# Patient Record
Sex: Female | Born: 1985 | Race: White | Hispanic: No | Smoking: Current every day smoker
Health system: Southern US, Community
[De-identification: ages and names within clinical notes are randomized; demographics above are authoritative.]

## PROBLEM LIST (undated history)

## (undated) DIAGNOSIS — F32A Depression, unspecified: Secondary | ICD-10-CM

## (undated) DIAGNOSIS — M542 Cervicalgia: Secondary | ICD-10-CM

## (undated) DIAGNOSIS — R51 Headache: Secondary | ICD-10-CM

## (undated) DIAGNOSIS — R519 Headache, unspecified: Secondary | ICD-10-CM

## (undated) DIAGNOSIS — G8929 Other chronic pain: Secondary | ICD-10-CM

## (undated) DIAGNOSIS — F329 Major depressive disorder, single episode, unspecified: Secondary | ICD-10-CM

## (undated) DIAGNOSIS — F419 Anxiety disorder, unspecified: Secondary | ICD-10-CM

## (undated) DIAGNOSIS — R569 Unspecified convulsions: Secondary | ICD-10-CM

## (undated) HISTORY — PX: LAPAROSCOPIC SALPINGO OOPHERECTOMY: SHX5927

## (undated) HISTORY — PX: TUBAL LIGATION: SHX77

## (undated) HISTORY — PX: ECTOPIC PREGNANCY SURGERY: SHX613

---

## 2004-01-05 ENCOUNTER — Emergency Department (HOSPITAL_COMMUNITY): Admission: EM | Admit: 2004-01-05 | Discharge: 2004-01-06 | Payer: Self-pay | Admitting: Emergency Medicine

## 2004-01-10 ENCOUNTER — Emergency Department (HOSPITAL_COMMUNITY): Admission: EM | Admit: 2004-01-10 | Discharge: 2004-01-10 | Payer: Self-pay | Admitting: Emergency Medicine

## 2005-04-28 ENCOUNTER — Emergency Department (HOSPITAL_COMMUNITY): Admission: EM | Admit: 2005-04-28 | Discharge: 2005-04-28 | Payer: Self-pay | Admitting: Emergency Medicine

## 2005-07-05 ENCOUNTER — Ambulatory Visit (HOSPITAL_COMMUNITY): Admission: RE | Admit: 2005-07-05 | Discharge: 2005-07-05 | Payer: Self-pay | Admitting: *Deleted

## 2005-08-02 ENCOUNTER — Ambulatory Visit (HOSPITAL_COMMUNITY): Admission: RE | Admit: 2005-08-02 | Discharge: 2005-08-02 | Payer: Self-pay | Admitting: *Deleted

## 2005-12-29 ENCOUNTER — Ambulatory Visit: Payer: Self-pay | Admitting: Gynecology

## 2005-12-29 ENCOUNTER — Inpatient Hospital Stay (HOSPITAL_COMMUNITY): Admission: AD | Admit: 2005-12-29 | Discharge: 2006-01-01 | Payer: Self-pay | Admitting: Obstetrics and Gynecology

## 2006-04-07 ENCOUNTER — Emergency Department (HOSPITAL_COMMUNITY): Admission: EM | Admit: 2006-04-07 | Discharge: 2006-04-08 | Payer: Self-pay | Admitting: Emergency Medicine

## 2013-03-03 ENCOUNTER — Emergency Department (HOSPITAL_COMMUNITY)
Admission: EM | Admit: 2013-03-03 | Discharge: 2013-03-03 | Disposition: A | Discharge status: Home / Self Care | Payer: Medicaid - Out of State | Attending: Emergency Medicine | Admitting: Emergency Medicine

## 2013-03-03 ENCOUNTER — Encounter (HOSPITAL_COMMUNITY): Payer: Self-pay | Admitting: Emergency Medicine

## 2013-03-03 DIAGNOSIS — Z8659 Personal history of other mental and behavioral disorders: Secondary | ICD-10-CM | POA: Insufficient documentation

## 2013-03-03 DIAGNOSIS — N764 Abscess of vulva: Secondary | ICD-10-CM

## 2013-03-03 DIAGNOSIS — F17200 Nicotine dependence, unspecified, uncomplicated: Secondary | ICD-10-CM | POA: Insufficient documentation

## 2013-03-03 HISTORY — DX: Major depressive disorder, single episode, unspecified: F32.9

## 2013-03-03 HISTORY — DX: Depression, unspecified: F32.A

## 2013-03-03 HISTORY — DX: Anxiety disorder, unspecified: F41.9

## 2013-03-03 MED ORDER — SULFAMETHOXAZOLE-TRIMETHOPRIM 800-160 MG PO TABS: 1.0000 | ORAL_TABLET | Freq: Two times a day (BID) | ORAL | Status: DC

## 2013-03-03 MED ORDER — HYDROCODONE-ACETAMINOPHEN 5-325 MG PO TABS: ORAL_TABLET | ORAL | Status: DC

## 2013-03-03 MED ORDER — LIDOCAINE HCL (PF) 1 % IJ SOLN
5.0000 mL | Freq: Once | INTRAMUSCULAR | Status: AC
  Administered 2013-03-03: 5 mL via INTRADERMAL
  Filled 2013-03-03: qty 5

## 2013-03-03 NOTE — ED Provider Notes (Signed)
CSN: [id number]     Arrival date & time 03/03/13  1239 History  This chart was scribed for Pauline Aus, non-physician practitioner, working with Glynn Octave, MD by Bennett Scrape, ED Scribe. This patient was seen in room APFT24/APFT24 and the patient's care was started at 2:01 PM.    Chief Complaint  Patient presents with  . Abscess    Patient is a 27 y.o. female presenting with abscess. The history is provided by the patient. No language interpreter was used.  Abscess Location:  Ano-genital Size:  Quarter sized Abscess quality: draining, induration and painful   Red streaking: no   Duration:  7 days Ineffective treatments:  Draining/squeezing, cold compresses, warm compresses and warm water soaks Associated symptoms: no fever, no nausea and no vomiting     HPI Comments: DORELLA LASTER is a 27 y.o. female who presents to the Emergency Department complaining of an abscess to the genitals that started 7 days ago after shaving her pubic hair. She states that she has drained it once with purulent discharge but it has otherwise been draining clear to yellow drainage since then. She has tried ice packs, taken hot showers and applied pressure, rubbing alcohol and heating pads with no improvement. She reports that her husband has had prior episodes of MRSA on his arm, but she denies any prior episodes of MRSA herself although she admits to having prior episodes of 'in grown hairs' with similar symptoms. She denies any fevers, nauseas or abdominal pain.   Past Medical History  Diagnosis Date  . Anxiety   . Depression    Past Surgical History  Procedure Laterality Date  . Ectopic pregnancy surgery    . Laparoscopic salpingo oopherectomy    . Cesarean section     No family history on file. History  Substance Use Topics  . Smoking status: Current Every Day Smoker    Types: Cigarettes  . Smokeless tobacco: Not on file  . Alcohol Use: No   No OB history provided.  Review of  Systems  Constitutional: Negative for fever and chills.  Gastrointestinal: Negative for nausea and vomiting.  Musculoskeletal: Negative for arthralgias and joint swelling.  Skin: Positive for color change.       Abscess   Hematological: Negative for adenopathy.  All other systems reviewed and are negative.    Allergies  Review of patient's allergies indicates no known allergies.  Home Medications  No current outpatient prescriptions on file.  Triage Vitals: BP 109/41  Pulse 90  Temp(Src) 97.8 F (36.6 C) (Oral)  Resp 16  Ht 5\' 1"  (1.549 m)  Wt 104 lb (47.174 kg)  BMI 19.66 kg/m2  SpO2 100%  LMP 02/12/2013  Physical Exam  Nursing note and vitals reviewed. Constitutional: She is oriented to person, place, and time. She appears well-developed and well-nourished. No distress.  HENT:  Head: Normocephalic and atraumatic.  Cardiovascular: Normal rate, regular rhythm and normal heart sounds.   No murmur heard. Pulmonary/Chest: Effort normal and breath sounds normal. No respiratory distress.  Abdominal: Soft. There is no tenderness.  Neurological: She is alert and oriented to person, place, and time. She exhibits normal muscle tone. Coordination normal.  Skin: Skin is warm and dry. There is erythema.  Quarter-sized abscess to the left labia, no fluctuance, no surrounding erythema, moderate amount of induration     ED Course  Procedures (including critical care time)  DIAGNOSTIC STUDIES: Oxygen Saturation is 100% on room air, normal by my interpretation.  COORDINATION OF CARE: 2:01 PM-Discussed treatment plan which includes I&D with pt at bedside and pt agreed to plan.   Labs Review Labs Reviewed - No data to display Imaging Review No results found.  EKG Interpretation   None       MDM  INCISION AND DRAINAGE Performed by: Pauline Aus L. Consent: Verbal consent obtained. Risks and benefits: risks, benefits and alternatives were discussed Type:  abscess  Body area: left labia Anesthesia: local infiltration  Incision was made with a #11 scalpel.  Local anesthetic: lidocaine 1 % w/o epinephrine  Anesthetic total: 3 ml  Complexity: complex Blunt dissection to break up loculations  Drainage: purulent  Drainage amount: small Packing material: 1/4 in iodoform gauze  Patient tolerance: Patient tolerated the procedure well with no immediate complications.     Patient is feeling better, agrees to packing removal in 2 days, warm water soaks.  Bactrim and #12 vicodin. To return here for recheck if needed  I personally performed the services described in this documentation, which was scribed in my presence. The recorded information has been reviewed and is accurate.    Orel Hord L. Corra Kaine, PA-C 03/05/13 1303

## 2013-03-03 NOTE — ED Notes (Signed)
States boil to "private area" which began as an ingrown hair. Area is not draining. First noticed a week ago.

## 2013-03-03 NOTE — Progress Notes (Signed)
ED/CM noted patient did not have health insurance and/or PCP listed in the computer.  Patient was given the [place] resource handout with information on the clinics, food pantries, and the handout for new health insurance sign-up.  Patient expressed appreciation for this. 

## 2013-03-03 NOTE — ED Notes (Signed)
Abscess to groin area for 1.5 weeks, Has "popped" the area mult times. Alert, NAD.

## 2013-03-03 NOTE — ED Notes (Signed)
Pt has red swollen area to lt labia,

## 2013-03-05 ENCOUNTER — Emergency Department (HOSPITAL_COMMUNITY)
Admission: EM | Admit: 2013-03-05 | Discharge: 2013-03-05 | Disposition: A | Discharge status: Home / Self Care | Payer: Medicaid - Out of State | Attending: Emergency Medicine | Admitting: Emergency Medicine

## 2013-03-05 ENCOUNTER — Encounter (HOSPITAL_COMMUNITY): Payer: Self-pay | Admitting: Emergency Medicine

## 2013-03-05 DIAGNOSIS — Z792 Long term (current) use of antibiotics: Secondary | ICD-10-CM | POA: Insufficient documentation

## 2013-03-05 DIAGNOSIS — F329 Major depressive disorder, single episode, unspecified: Secondary | ICD-10-CM | POA: Insufficient documentation

## 2013-03-05 DIAGNOSIS — F17200 Nicotine dependence, unspecified, uncomplicated: Secondary | ICD-10-CM | POA: Insufficient documentation

## 2013-03-05 DIAGNOSIS — F411 Generalized anxiety disorder: Secondary | ICD-10-CM | POA: Insufficient documentation

## 2013-03-05 DIAGNOSIS — Z79899 Other long term (current) drug therapy: Secondary | ICD-10-CM | POA: Insufficient documentation

## 2013-03-05 DIAGNOSIS — F3289 Other specified depressive episodes: Secondary | ICD-10-CM | POA: Insufficient documentation

## 2013-03-05 DIAGNOSIS — Z5189 Encounter for other specified aftercare: Secondary | ICD-10-CM

## 2013-03-05 DIAGNOSIS — Z4801 Encounter for change or removal of surgical wound dressing: Secondary | ICD-10-CM | POA: Insufficient documentation

## 2013-03-05 NOTE — ED Provider Notes (Signed)
Medical screening examination/treatment/procedure(s) were performed by non-physician practitioner and as supervising physician I was immediately available for consultation/collaboration.  EKG Interpretation   None        Violanda Bobeck, MD 03/05/13 2243 

## 2013-03-05 NOTE — ED Provider Notes (Signed)
CSN: [id number]     Arrival date & time 03/05/13  1939 History   First MD Initiated Contact with Patient 03/05/13 1959     Chief Complaint  Patient presents with  . Follow-up   (Consider location/radiation/quality/duration/timing/severity/associated sxs/prior Treatment) HPI Angelica Cuevas is a 28 y.o. female who presents to the ED for wound recheck. She was evaluated here 03/05/2013 for an abscess to the left labia. She returns today complaining of pain and to have her packing removed.   Past Medical History  Diagnosis Date  . Anxiety   . Depression    Past Surgical History  Procedure Laterality Date  . Ectopic pregnancy surgery    . Laparoscopic salpingo oopherectomy    . Cesarean section    . Tubal ligation     History reviewed. No pertinent family history. History  Substance Use Topics  . Smoking status: Current Every Day Smoker    Types: Cigarettes  . Smokeless tobacco: Not on file  . Alcohol Use: No   OB History   Grav Para Term Preterm Abortions TAB SAB Ect Mult Living                 Review of Systems Negative except as stated in HPI  Allergies  Review of patient's allergies indicates no known allergies.  Home Medications   Current Outpatient Rx  Name  Route  Sig  Dispense  Refill  . ALPRAZolam (XANAX) 1 MG tablet   Oral   Take 1 mg by mouth 2 (two) times daily.         Marland Kitchen HYDROcodone-acetaminophen (NORCO/VICODIN) 5-325 MG per tablet      Take one-two tabs po q 4-6 hrs prn pain   12 tablet   0   . Naproxen-Esomeprazole (VIMOVO) 500-20 MG TBEC   Oral   Take 1 tablet by mouth 2 (two) times daily.         Marland Kitchen sulfamethoxazole-trimethoprim (SEPTRA DS) 800-160 MG per tablet   Oral   Take 1 tablet by mouth 2 (two) times daily.   20 tablet   0   . Vilazodone HCl (VIIBRYD) 10 MG TABS   Oral   Take 10 mg by mouth daily.          BP 97/60  Pulse 107  Temp(Src) 98.1 F (36.7 C) (Oral)  Resp 20  Ht 5\' 1"  (1.549 m)  Wt 104 lb (47.174 kg)  BMI  19.66 kg/m2  SpO2 97%  LMP 02/12/2013 Physical Exam  Nursing note and vitals reviewed. Constitutional: She is oriented to person, place, and time. She appears well-developed and well-nourished.  HENT:  Head: Normocephalic.  Eyes: EOM are normal.  Neck: Neck supple.  Cardiovascular: Normal rate.   Pulmonary/Chest: Effort normal.  Genitourinary:    There is tenderness around the vagina.  Left labia with packing. Tender on palpation. Packing removed without difficulty. After packing removed area continues to have purulent drainage.   Musculoskeletal: Normal range of motion.  Neurological: She is alert and oriented to person, place, and time. No cranial nerve deficit.  Skin: Skin is warm and dry.  Psychiatric: She has a normal mood and affect. Her behavior is normal.    ED Course  Procedures Packing removal MDM  27 y.o. female with abscess to the left labia here for recheck and packing removal. Abscess continues to drain. Patient stable for discharge home. She is afebrile and area has improved. She will continue sitz baths and her antibiotics. She will return as  needed.  Discussed with the patient and all questioned fully answered.   Janne Napoleon, [place] 03/05/13 2142

## 2013-03-05 NOTE — ED Notes (Signed)
Had I and D of abscess of buttock.  Pt says she is having a lot of pain and cannot remove the packing.

## 2013-03-06 NOTE — ED Provider Notes (Signed)
Medical screening examination/treatment/procedure(s) were performed by non-physician practitioner and as supervising physician I was immediately available for consultation/collaboration.  EKG Interpretation   None         Lamerle Jabs, MD 03/06/13 0941 

## 2013-04-16 ENCOUNTER — Encounter (HOSPITAL_COMMUNITY): Payer: Self-pay | Admitting: Emergency Medicine

## 2013-04-16 ENCOUNTER — Emergency Department (HOSPITAL_COMMUNITY)
Admission: EM | Admit: 2013-04-16 | Discharge: 2013-04-16 | Disposition: A | Discharge status: Home / Self Care | Payer: Medicaid - Out of State | Attending: Emergency Medicine | Admitting: Emergency Medicine

## 2013-04-16 DIAGNOSIS — F19939 Other psychoactive substance use, unspecified with withdrawal, unspecified: Secondary | ICD-10-CM | POA: Insufficient documentation

## 2013-04-16 DIAGNOSIS — Z79899 Other long term (current) drug therapy: Secondary | ICD-10-CM | POA: Insufficient documentation

## 2013-04-16 DIAGNOSIS — F1310 Sedative, hypnotic or anxiolytic abuse, uncomplicated: Secondary | ICD-10-CM | POA: Insufficient documentation

## 2013-04-16 DIAGNOSIS — G40909 Epilepsy, unspecified, not intractable, without status epilepticus: Secondary | ICD-10-CM | POA: Insufficient documentation

## 2013-04-16 DIAGNOSIS — F411 Generalized anxiety disorder: Secondary | ICD-10-CM | POA: Insufficient documentation

## 2013-04-16 DIAGNOSIS — R5381 Other malaise: Secondary | ICD-10-CM | POA: Insufficient documentation

## 2013-04-16 DIAGNOSIS — F329 Major depressive disorder, single episode, unspecified: Secondary | ICD-10-CM | POA: Insufficient documentation

## 2013-04-16 DIAGNOSIS — R51 Headache: Secondary | ICD-10-CM | POA: Insufficient documentation

## 2013-04-16 DIAGNOSIS — R42 Dizziness and giddiness: Secondary | ICD-10-CM | POA: Insufficient documentation

## 2013-04-16 DIAGNOSIS — F19239 Other psychoactive substance dependence with withdrawal, unspecified: Secondary | ICD-10-CM

## 2013-04-16 DIAGNOSIS — R404 Transient alteration of awareness: Secondary | ICD-10-CM | POA: Insufficient documentation

## 2013-04-16 DIAGNOSIS — F17200 Nicotine dependence, unspecified, uncomplicated: Secondary | ICD-10-CM | POA: Insufficient documentation

## 2013-04-16 DIAGNOSIS — F3289 Other specified depressive episodes: Secondary | ICD-10-CM | POA: Insufficient documentation

## 2013-04-16 DIAGNOSIS — R4182 Altered mental status, unspecified: Secondary | ICD-10-CM | POA: Insufficient documentation

## 2013-04-16 HISTORY — DX: Unspecified convulsions: R56.9

## 2013-04-16 MED ORDER — DIAZEPAM 5 MG PO TABS: ORAL_TABLET | ORAL | Status: DC

## 2013-04-16 NOTE — ED Provider Notes (Signed)
CSN: [id number]     Arrival date & time 04/16/13  1817 History   First MD Initiated Contact with Patient 04/16/13 1926     Chief Complaint  Patient presents with  . Seizures   (Consider location/radiation/quality/duration/timing/severity/associated sxs/prior Treatment) HPI [name] is a 27 y.o. female w/ PMHx of Anxiety, Depression, and previous history of seizures, presents to the ED w/ complaints of a seizure yesterday. The patient's husband was at bedside and witnessed the event. He claimed it lasted for about 1-2 minutes, involved tonic-clonic movements, "foaming" from the mouth, and apparent complete loss of consciousness. She also claims she bit her tongue, but denies bowel or bladder incontinence. The husband claims she also had post-ictal symptoms such as altered mental status and lethargy. The patient claims she first had a seizure in 2006 and has had several since. She has been evaluated by a neurologist in the past and claims the neurologist attributes these events as "anxiety attacks". She currently does not take any anti-seizure medications.  Today, she presents to the ED because she would like to be further evaluated for her recent seizure activity. The patient has been on Xanax for several years and was recently taken off of them by her PCP in [place]. He last Xanax was 2-3 days ago, just prior to having a her seizure yesterday.  Past Medical History  Diagnosis Date  . Anxiety   . Depression   . Seizures    Past Surgical History  Procedure Laterality Date  . Ectopic pregnancy surgery    . Laparoscopic salpingo oopherectomy    . Cesarean section    . Tubal ligation     History reviewed. No pertinent family history. History  Substance Use Topics  . Smoking status: Current Every Day Smoker    Types: Cigarettes  . Smokeless tobacco: Not on file  . Alcohol Use: No   OB History   Grav Para Term Preterm Abortions TAB SAB Ect Mult Living                 Review  of Systems General: Positive for fatigue. Denies fever, chills, diaphoresis, appetite change.  Respiratory: Denies SOB, DOE, cough, chest tightness, and wheezing.   Cardiovascular: Denies chest pain, palpitations and leg swelling.  Gastrointestinal: Denies nausea, vomiting, abdominal pain, diarrhea, constipation, blood in stool and abdominal distention.  Genitourinary: Denies dysuria, urgency, frequency, hematuria, flank pain and difficulty urinating.  Endocrine: Denies hot or cold intolerance, sweats, polyuria, polydipsia. Musculoskeletal: Denies myalgias, back pain, joint swelling, arthralgias and gait problem.  Skin: Denies pallor, rash and wounds.  Neurological: Positive for dizziness, lightheadedness, headaches, seizures. Denies syncope, weakness, numbness.  Psychiatric/Behavioral: Denies mood changes, confusion, nervousness, sleep disturbance and agitation.  Allergies  Review of patient's allergies indicates no known allergies.  Home Medications   Current Outpatient Rx  Name  Route  Sig  Dispense  Refill  . escitalopram (LEXAPRO) 10 MG tablet   Oral   Take 10 mg by mouth daily.         . hydrOXYzine (ATARAX/VISTARIL) 25 MG tablet   Oral   Take 25 mg by mouth 3 (three) times daily as needed for anxiety.         . lamoTRIgine (LAMICTAL) 25 MG tablet   Oral   Take 25 mg by mouth 2 (two) times daily. PATIENT IS PRESCRIBED WITH THE FOLLOWING REGIMEN (PRESCRIPTION DATED 04/09/2013--  Take one tablet by mouth twice daily for 2 weeks, then take two tablets daily  for 2 weeks, then take four tablets daily for 2 weeks, then take 8 tablets daily for 2 weeks          Physical Exam Filed Vitals:   04/16/13 1901  BP: 114/70  Pulse: 86  Temp: 98.1 F (36.7 C)  TempSrc: Oral  Resp: 16  Height: 5\' 1"  (1.549 m)  Weight: 115 lb (52.164 kg)  SpO2: 100%  General: Vital signs reviewed.  Patient is a well-developed and well-nourished, in no acute distress and cooperative with exam.  Alert and oriented x3.  Head: Normocephalic and atraumatic. Eyes: PERRL, EOMI, conjunctivae normal, No scleral icterus.  Neck: Supple, trachea midline, normal ROM, No JVD, masses, thyromegaly, or carotid bruit present.  Cardiovascular: RRR, S1 normal, S2 normal, no murmurs, gallops, or rubs. Pulmonary/Chest: Normal respiratory effort, CTAB, no wheezes, rales, or rhonchi. Abdominal: Soft, non-tender, non-distended, bowel sounds are normal, no masses, organomegaly, or guarding present.  Musculoskeletal: No joint deformities, erythema, or stiffness, ROM full and no nontender. Extremities: No swelling or edema,  pulses symmetric and intact bilaterally. No cyanosis or clubbing. Neurological: A&O x3, Strength is normal and symmetric bilaterally, cranial nerve II-XII are grossly intact, no focal motor deficit, sensory intact to light touch bilaterally.  Skin: Warm, dry and intact. No rashes or erythema. Psychiatric: Normal mood and affect. speech and behavior is normal. Cognition and memory are normal.   ED Course  Procedures (including critical care time) Labs Review Labs Reviewed - No data to display Imaging Review No results found.  EKG Interpretation   None      MDM   [name] is a 27 y.o. female w/ PMHx of Anxiety, Depression, and previous history of seizures, presents to the ED w/ complaints of a seizure yesterday, most likely 2/2 benzodiazepine withdrawal. The patient was recently taken off of Xanax by her new PCP and claims she has not taken any for 2-3 days. She used to take 2-3 mg daily.  Given patient's clinical presentation, she is stable for discharge w/ a long acting benzodiazepine taper as follows: -Advised patient to take Valium 5 mg tid for 1 week, then 5 mg bid for 1 week, then 5 mg qd for 1 week, then stop.   Will give resource guide for detox from benzos + follow up options with PCP.  Courtney Paris, MD 04/16/13 2009

## 2013-04-16 NOTE — ED Notes (Addendum)
Had a seizure yesterday, and now has back pain.  No xanax for 2 days prior to sz.  Since then has gotten xanax from a relative and taken it. Headache,

## 2013-04-18 NOTE — ED Provider Notes (Signed)
I saw and evaluated the patient, reviewed the resident's note and I agree with the findings and plan.   .Face to face Exam:  General:  Awake HEENT:  Atraumatic Resp:  Normal effort Abd:  Nondistended Neuro:No focal weakness   Nelia Shi, MD 04/18/13 1157

## 2013-05-08 ENCOUNTER — Ambulatory Visit: Payer: Medicaid - Out of State | Admitting: Neurology

## 2013-05-08 ENCOUNTER — Telehealth: Payer: Self-pay | Admitting: Neurology

## 2013-05-08 NOTE — Telephone Encounter (Signed)
This patient did not show for the new patient appointment today. 

## 2013-10-15 ENCOUNTER — Emergency Department (HOSPITAL_COMMUNITY)
Admission: EM | Admit: 2013-10-15 | Discharge: 2013-10-16 | Disposition: A | Discharge status: Home / Self Care | Payer: Medicaid - Out of State | Attending: Emergency Medicine | Admitting: Emergency Medicine

## 2013-10-15 ENCOUNTER — Encounter (HOSPITAL_COMMUNITY): Payer: Self-pay | Admitting: Emergency Medicine

## 2013-10-15 DIAGNOSIS — G40909 Epilepsy, unspecified, not intractable, without status epilepticus: Secondary | ICD-10-CM | POA: Insufficient documentation

## 2013-10-15 DIAGNOSIS — IMO0001 Reserved for inherently not codable concepts without codable children: Secondary | ICD-10-CM | POA: Insufficient documentation

## 2013-10-15 DIAGNOSIS — Z79899 Other long term (current) drug therapy: Secondary | ICD-10-CM | POA: Insufficient documentation

## 2013-10-15 DIAGNOSIS — Z8659 Personal history of other mental and behavioral disorders: Secondary | ICD-10-CM | POA: Insufficient documentation

## 2013-10-15 DIAGNOSIS — F17200 Nicotine dependence, unspecified, uncomplicated: Secondary | ICD-10-CM | POA: Insufficient documentation

## 2013-10-15 DIAGNOSIS — R51 Headache: Secondary | ICD-10-CM | POA: Insufficient documentation

## 2013-10-15 DIAGNOSIS — R42 Dizziness and giddiness: Secondary | ICD-10-CM | POA: Insufficient documentation

## 2013-10-15 DIAGNOSIS — R519 Headache, unspecified: Secondary | ICD-10-CM

## 2013-10-15 DIAGNOSIS — G8929 Other chronic pain: Secondary | ICD-10-CM | POA: Insufficient documentation

## 2013-10-15 DIAGNOSIS — R110 Nausea: Secondary | ICD-10-CM | POA: Insufficient documentation

## 2013-10-15 MED ORDER — IBUPROFEN 800 MG PO TABS
800.0000 mg | ORAL_TABLET | Freq: Once | ORAL | Status: AC
  Administered 2013-10-16: 800 mg via ORAL
  Filled 2013-10-15: qty 1

## 2013-10-15 MED ORDER — HYDROCODONE-ACETAMINOPHEN 5-325 MG PO TABS
2.0000 | ORAL_TABLET | Freq: Once | ORAL | Status: AC
  Administered 2013-10-16: 2 via ORAL
  Filled 2013-10-15: qty 2

## 2013-10-15 MED ORDER — PROMETHAZINE HCL 12.5 MG PO TABS
12.5000 mg | ORAL_TABLET | Freq: Once | ORAL | Status: AC
  Administered 2013-10-16: 12.5 mg via ORAL
  Filled 2013-10-15: qty 1

## 2013-10-15 NOTE — ED Notes (Signed)
Patient complaining of a headache that started around 3 days ago per pt.

## 2013-10-15 NOTE — ED Provider Notes (Signed)
CSN: [id number]     Arrival date & time 10/15/13  2225 History   First MD Initiated Contact with Patient 10/15/13 2327     Chief Complaint  Patient presents with  . Headache     (Consider location/radiation/quality/duration/timing/severity/associated sxs/prior Treatment) Patient is a 28 y.o. female presenting with headaches. The history is provided by the patient.  Headache Pain location:  L temporal and R temporal Quality:  Sharp Radiates to: ear and teeth. Severity at highest:  9/10 Onset quality:  Gradual Duration:  3 days Timing:  Intermittent Progression:  Worsening Chronicity:  Recurrent Similar to prior headaches: yes   Context comment:  No known triggers Relieved by:  Nothing Worsened by:  Nothing tried Ineffective treatments:  None tried Associated symptoms: dizziness, myalgias and nausea   Associated symptoms: no abdominal pain, no back pain, no cough, no neck pain, no photophobia and no seizures   Associated symptoms comment:  Tooth pain. Intolerance of people talking. Risk factors: [facility]     Past Medical History  Diagnosis Date  . Anxiety   . Depression   . Seizures    Past Surgical History  Procedure Laterality Date  . Ectopic pregnancy surgery    . Laparoscopic salpingo oopherectomy    . Cesarean section    . Tubal ligation     No family history on file. History  Substance Use Topics  . Smoking status: Current Every Day Smoker    Types: Cigarettes  . Smokeless tobacco: Not on file  . Alcohol Use: No   OB History   Grav Para Term Preterm Abortions TAB SAB Ect Mult Living                 Review of Systems  Constitutional: Negative for activity change.       All ROS Neg except as noted in HPI  HENT: Negative for nosebleeds.   Eyes: Negative for photophobia and discharge.  Respiratory: Negative for cough, shortness of breath and wheezing.   Cardiovascular: Negative for chest pain and palpitations.  Gastrointestinal: Positive for  nausea. Negative for abdominal pain and blood in stool.  Genitourinary: Negative for dysuria, frequency and hematuria.  Musculoskeletal: Positive for myalgias. Negative for arthralgias, back pain and neck pain.  Skin: Negative.   Neurological: Positive for dizziness and headaches. Negative for seizures and speech difficulty.  Psychiatric/Behavioral: Negative for hallucinations and confusion.      Allergies  Review of patient's allergies indicates no known allergies.  Home Medications   Prior to Admission medications   Medication Sig Start Date End Date Taking? Authorizing Provider  levETIRAcetam (KEPPRA) 1000 MG tablet Take 1,000 mg by mouth 2 (two) times daily.   Yes Historical Provider, MD   BP 125/87  Pulse 98  Temp(Src) 98 F (36.7 C) (Oral)  Resp 20  Ht 5\' 1"  (1.549 m)  Wt 135 lb (61.236 kg)  BMI 25.52 kg/m2  SpO2 100%  LMP 10/12/2013 Physical Exam  Nursing note and vitals reviewed. Constitutional: She is oriented to person, place, and time. She appears well-developed and well-nourished.  Non-toxic appearance. No distress.  HENT:  Head: Normocephalic.  Right Ear: Tympanic membrane and external ear normal.  Left Ear: Tympanic membrane and external ear normal.  Eyes: EOM and lids are normal. Pupils are equal, round, and reactive to light.  Neck: Normal range of motion. Neck supple. Carotid bruit is not present.  Cardiovascular: Normal rate, regular rhythm, normal heart sounds, intact distal pulses and normal  pulses.   Pulmonary/Chest: Breath sounds normal. No respiratory distress.  Abdominal: Soft. Bowel sounds are normal. There is no tenderness. There is no guarding.  Musculoskeletal: Normal range of motion.  Lymphadenopathy:       Head (right side): No submandibular adenopathy present.       Head (left side): No submandibular adenopathy present.    She has no cervical adenopathy.  Neurological: She is alert and oriented to person, place, and time. She has normal  strength. No cranial nerve deficit or sensory deficit. She exhibits normal muscle tone. Coordination normal.  Skin: Skin is warm and dry.  Psychiatric: She has a normal mood and affect. Her speech is normal.    ED Course  Procedures (including critical care time) Labs Review Labs Reviewed - No data to display  Imaging Review No results found.   EKG Interpretation None      MDM Patient has history of headaches for quite some time. The patient states over last 3 days she has not been in her to get her headache to resolve with Tylenol ibuprofen or marijuana.  No gross neurologic deficits appreciated. Vital signs are well within normal limits. Pulse oximetry is 100% on room air, within normal limits by my interpretation.  The plan at this time is for the patient to see her neurology specialist concerning her headaches as soon as possible. Prescription for promethazine suppositories one every 6 hours as needed, and Norco 5 mg one every 4 hours #15 tablets given to the patient.    Final diagnoses:  None    *I have reviewed nursing notes, vital signs, and all appropriate lab and imaging results for this patient.Angelica Cuevas**    Hobson M Bryant, PA-C 10/17/13 [phone]

## 2013-10-16 MED ORDER — PROMETHAZINE HCL 25 MG RE SUPP: 25.0000 mg | Freq: Four times a day (QID) | RECTAL | Status: DC | PRN

## 2013-10-16 MED ORDER — HYDROCODONE-ACETAMINOPHEN 5-325 MG PO TABS: 1.0000 | ORAL_TABLET | ORAL | Status: DC | PRN

## 2013-10-16 NOTE — ED Notes (Signed)
Pt pain free on discharge, prescriptions  Given and no questions or concerns voiced. Ambulatory on discharge.

## 2013-10-16 NOTE — Discharge Instructions (Signed)
Please see her neurology specialist as soon as possible for evaluation concerning her headaches. These use Phenergan every 6 hours as needed for nausea. Use Tylenol or ibuprofen for mild pain, use Norco for more severe pain. Promethazine and Norco may cause drowsiness, please use with caution.

## 2013-10-19 NOTE — ED Provider Notes (Signed)
Medical screening examination/treatment/procedure(s) were performed by non-physician practitioner and as supervising physician I was immediately available for consultation/collaboration.   EKG Interpretation None        Lyanne CoKevin M Campos, MD 10/19/13 [phone]

## 2013-12-18 ENCOUNTER — Emergency Department (HOSPITAL_COMMUNITY)
Admission: EM | Admit: 2013-12-18 | Discharge: 2013-12-18 | Disposition: A | Discharge status: Home / Self Care | Payer: Medicaid - Out of State | Attending: Emergency Medicine | Admitting: Emergency Medicine

## 2013-12-18 ENCOUNTER — Encounter (HOSPITAL_COMMUNITY): Payer: Self-pay | Admitting: Emergency Medicine

## 2013-12-18 DIAGNOSIS — F329 Major depressive disorder, single episode, unspecified: Secondary | ICD-10-CM | POA: Insufficient documentation

## 2013-12-18 DIAGNOSIS — Z79899 Other long term (current) drug therapy: Secondary | ICD-10-CM | POA: Insufficient documentation

## 2013-12-18 DIAGNOSIS — Z792 Long term (current) use of antibiotics: Secondary | ICD-10-CM | POA: Insufficient documentation

## 2013-12-18 DIAGNOSIS — F411 Generalized anxiety disorder: Secondary | ICD-10-CM | POA: Diagnosis not present

## 2013-12-18 DIAGNOSIS — F17200 Nicotine dependence, unspecified, uncomplicated: Secondary | ICD-10-CM | POA: Insufficient documentation

## 2013-12-18 DIAGNOSIS — H9209 Otalgia, unspecified ear: Secondary | ICD-10-CM | POA: Diagnosis present

## 2013-12-18 DIAGNOSIS — G43909 Migraine, unspecified, not intractable, without status migrainosus: Secondary | ICD-10-CM | POA: Insufficient documentation

## 2013-12-18 DIAGNOSIS — H6092 Unspecified otitis externa, left ear: Secondary | ICD-10-CM

## 2013-12-18 DIAGNOSIS — F3289 Other specified depressive episodes: Secondary | ICD-10-CM | POA: Diagnosis not present

## 2013-12-18 DIAGNOSIS — H60399 Other infective otitis externa, unspecified ear: Secondary | ICD-10-CM | POA: Diagnosis not present

## 2013-12-18 MED ORDER — CIPROFLOXACIN-HYDROCORTISONE 0.2-1 % OT SUSP: 3.0000 [drp] | Freq: Two times a day (BID) | OTIC | Status: DC

## 2013-12-18 MED ORDER — ANTIPYRINE-BENZOCAINE 5.4-1.4 % OT SOLN
3.0000 [drp] | OTIC | Status: AC | PRN
  Administered 2013-12-18: 3 [drp] via OTIC
  Filled 2013-12-18: qty 10

## 2013-12-18 NOTE — ED Notes (Signed)
Pain to left ear x 3 days.  Taking some of sister's amoxicillin and lortab, last dose at 0900 this morning with some relief.

## 2013-12-18 NOTE — ED Notes (Signed)
Patient with no complaints at this time. Respirations even and unlabored. Skin warm/dry. Discharge instructions reviewed with patient at this time. Patient given opportunity to voice concerns/ask questions. Patient discharged at this time and left Emergency Department with steady gait.   

## 2013-12-18 NOTE — Discharge Instructions (Signed)

## 2013-12-18 NOTE — ED Provider Notes (Signed)
CSN: [id number]     Arrival date & time 12/18/13  1046 History   First MD Initiated Contact with Patient 12/18/13 1056     Chief Complaint  Patient presents with  . Otalgia     (Consider location/radiation/quality/duration/timing/severity/associated sxs/prior Treatment) HPI Comments: Patient is a 28 year old female with history of seizures since the emergency department today with 3 days of gradually worsening left ear pain. She reports that the pain is worse when she touches her ear. She describes her pain as aching. She took her sister's amoxicillin today as well as a lortab with some relief of her symptoms. No fevers, chills, nausea, vomiting, chest pain, shortness of breath.   Patient is a 28 y.o. female presenting with ear pain. The history is provided by the patient. No language interpreter was used.  Otalgia Associated symptoms: no congestion, no fever, no sore throat and no vomiting     Past Medical History  Diagnosis Date  . Anxiety   . Depression   . Seizures    Past Surgical History  Procedure Laterality Date  . Ectopic pregnancy surgery    . Laparoscopic salpingo oopherectomy    . Cesarean section    . Tubal ligation     No family history on file. History  Substance Use Topics  . Smoking status: Current Every Day Smoker    Types: Cigarettes  . Smokeless tobacco: Not on file  . Alcohol Use: No   OB History   Grav Para Term Preterm Abortions TAB SAB Ect Mult Living                 Review of Systems  Constitutional: Negative for fever and chills.  HENT: Positive for ear pain. Negative for congestion and sore throat.   Cardiovascular: Negative for chest pain.  Gastrointestinal: Negative for nausea and vomiting.  All other systems reviewed and are negative.     Allergies  Review of patient's allergies indicates no known allergies.  Home Medications   Prior to Admission medications   Medication Sig Start Date End Date Taking? Authorizing Provider   acetaminophen (TYLENOL) 500 MG tablet Take 500 mg by mouth every 6 (six) hours as needed for moderate pain.   Yes Historical Provider, MD  amoxicillin (AMOXIL) 500 MG capsule Take 500 mg by mouth 2 (two) times daily.   Yes Historical Provider, MD  aspirin-acetaminophen-caffeine (EXCEDRIN MIGRAINE) [phone] MG per tablet Take 1 tablet by mouth every 6 (six) hours as needed for headache.   Yes Historical Provider, MD  FLUoxetine (PROZAC) 20 MG capsule Take 20 mg by mouth daily.   Yes Historical Provider, MD  HYDROcodone-acetaminophen (NORCO) 10-325 MG per tablet Take 1 tablet by mouth every 6 (six) hours as needed.   Yes Historical Provider, MD  levETIRAcetam (KEPPRA) 1000 MG tablet Take 1,000 mg by mouth 2 (two) times daily.   Yes Historical Provider, MD  ciprofloxacin-hydrocortisone (CIPRO HC) otic suspension Place 3 drops into the left ear 2 (two) times daily. 12/18/13   Mora Bellman, PA-C   BP 113/67  Pulse 89  Temp(Src) 98.1 F (36.7 C) (Oral)  Resp 18  Ht 5\' 1"  (1.549 m)  Wt 115 lb (52.164 kg)  BMI 21.74 kg/m2  SpO2 99%  LMP 12/16/2013 Physical Exam  Nursing note and vitals reviewed. Constitutional: She is oriented to person, place, and time. She appears well-developed and well-nourished. No distress.  HENT:  Head: Normocephalic and atraumatic.  Right Ear: Tympanic membrane, external ear and ear canal normal.  No mastoid tenderness.  Left Ear: Tympanic membrane and external ear normal. No mastoid tenderness.  Nose: Nose normal.  Mouth/Throat: Oropharynx is clear and moist.  Mild edema to next left ear canal.   Eyes: Conjunctivae are normal.  Neck: Normal range of motion.  Cardiovascular: Normal rate, regular rhythm and normal heart sounds.   Pulmonary/Chest: Effort normal and breath sounds normal. No stridor. No respiratory distress. She has no wheezes. She has no rales.  Abdominal: Soft. She exhibits no distension.  Musculoskeletal: Normal range of motion.  Neurological:  She is alert and oriented to person, place, and time. She has normal strength.  Skin: Skin is warm and dry. She is not diaphoretic. No erythema.  Psychiatric: She has a normal mood and affect. Her behavior is normal.    ED Course  Procedures (including critical care time) Labs Review Labs Reviewed - No data to display  Imaging Review No results found.   EKG Interpretation None      MDM   Final diagnoses:  Otitis externa, left   Pt presenting with otitis externa. No canal occlusion, Pt afebrile in NAD. Exam non concerning for mastoiditis, cellulitis or malignant OE. Dc with cipro script.  Advised PCP follow up in 2-3 days if no improvement with treatment or no complete resolution by 7 days. Earlier f-u if patient develops rash, allergic reaction to medication, or loss of hearing.   Mora BellmanHannah S Ramsey Midgett, PA-C 12/21/13 [phone]

## 2013-12-21 NOTE — ED Provider Notes (Signed)
Medical screening examination/treatment/procedure(s) were performed by non-physician practitioner and as supervising physician I was immediately available for consultation/collaboration.   EKG Interpretation None        Tiawanna Luchsinger L Michaelia Beilfuss, MD 12/21/13 1705 

## 2014-06-15 ENCOUNTER — Emergency Department (HOSPITAL_COMMUNITY)
Admission: EM | Admit: 2014-06-15 | Discharge: 2014-06-16 | Disposition: A | Discharge status: Home / Self Care | Payer: Medicaid - Out of State | Attending: Emergency Medicine | Admitting: Emergency Medicine

## 2014-06-15 ENCOUNTER — Encounter (HOSPITAL_COMMUNITY): Payer: Self-pay | Admitting: Emergency Medicine

## 2014-06-15 DIAGNOSIS — Z792 Long term (current) use of antibiotics: Secondary | ICD-10-CM | POA: Insufficient documentation

## 2014-06-15 DIAGNOSIS — F329 Major depressive disorder, single episode, unspecified: Secondary | ICD-10-CM | POA: Diagnosis not present

## 2014-06-15 DIAGNOSIS — W010XXA Fall on same level from slipping, tripping and stumbling without subsequent striking against object, initial encounter: Secondary | ICD-10-CM | POA: Insufficient documentation

## 2014-06-15 DIAGNOSIS — Y9389 Activity, other specified: Secondary | ICD-10-CM | POA: Diagnosis not present

## 2014-06-15 DIAGNOSIS — G40909 Epilepsy, unspecified, not intractable, without status epilepticus: Secondary | ICD-10-CM | POA: Diagnosis not present

## 2014-06-15 DIAGNOSIS — Y9289 Other specified places as the place of occurrence of the external cause: Secondary | ICD-10-CM | POA: Diagnosis not present

## 2014-06-15 DIAGNOSIS — F419 Anxiety disorder, unspecified: Secondary | ICD-10-CM | POA: Insufficient documentation

## 2014-06-15 DIAGNOSIS — Y998 Other external cause status: Secondary | ICD-10-CM | POA: Insufficient documentation

## 2014-06-15 DIAGNOSIS — Z79899 Other long term (current) drug therapy: Secondary | ICD-10-CM | POA: Insufficient documentation

## 2014-06-15 DIAGNOSIS — S59912A Unspecified injury of left forearm, initial encounter: Secondary | ICD-10-CM | POA: Diagnosis not present

## 2014-06-15 DIAGNOSIS — S4992XA Unspecified injury of left shoulder and upper arm, initial encounter: Secondary | ICD-10-CM | POA: Diagnosis not present

## 2014-06-15 DIAGNOSIS — S161XXA Strain of muscle, fascia and tendon at neck level, initial encounter: Secondary | ICD-10-CM | POA: Diagnosis not present

## 2014-06-15 DIAGNOSIS — Z720 Tobacco use: Secondary | ICD-10-CM | POA: Diagnosis not present

## 2014-06-15 DIAGNOSIS — S199XXA Unspecified injury of neck, initial encounter: Secondary | ICD-10-CM | POA: Diagnosis present

## 2014-06-15 NOTE — ED Notes (Signed)
Pt states she slipped and fell yesterday. C/o pain to L. Arm and shoulder. States neck is sore and she has numbness to L. Hand, L. Leg and foot.

## 2014-06-16 MED ORDER — CYCLOBENZAPRINE HCL 10 MG PO TABS: 10.0000 mg | ORAL_TABLET | Freq: Two times a day (BID) | ORAL | Status: DC | PRN

## 2014-06-16 MED ORDER — KETOROLAC TROMETHAMINE 60 MG/2ML IM SOLN
60.0000 mg | Freq: Once | INTRAMUSCULAR | Status: AC
  Administered 2014-06-16: 60 mg via INTRAMUSCULAR
  Filled 2014-06-16: qty 2

## 2014-06-16 MED ORDER — NAPROXEN 500 MG PO TABS: 500.0000 mg | ORAL_TABLET | Freq: Two times a day (BID) | ORAL | Status: DC

## 2014-06-16 NOTE — Discharge Instructions (Signed)

## 2014-06-16 NOTE — ED Provider Notes (Signed)
CSN: [id number]     Arrival date & time 06/15/14  2212 History   First MD Initiated Contact with Patient 06/15/14 2357     Chief Complaint  Patient presents with  . Fall     (Consider location/radiation/quality/duration/timing/severity/associated sxs/prior Treatment) HPI Comments: Pt has presented after falling yesterday - states try to catch herself with her left hand outstretched, she had pain in her elbow shoulder and neck which was initially mild but has gradually worsened and now she has difficulty moving her arm or her neck because of pain in the muscles. She denies numbness weakness or tingling on my history. She has ambulated without difficulty into the emergency department. She denies loss of consciousness or change in vision. This occurred yesterday, symptoms are persistent, worse with moving the muscles.  Patient is a 29 y.o. female presenting with fall. The history is provided by the patient and a relative.  Fall    Past Medical History  Diagnosis Date  . Anxiety   . Depression   . Seizures    Past Surgical History  Procedure Laterality Date  . Ectopic pregnancy surgery    . Laparoscopic salpingo oopherectomy    . Cesarean section    . Tubal ligation     History reviewed. No pertinent family history. History  Substance Use Topics  . Smoking status: Current Every Day Smoker    Types: Cigarettes  . Smokeless tobacco: Not on file  . Alcohol Use: No   OB History    No data available     Review of Systems  Gastrointestinal: Negative for nausea and vomiting.  Musculoskeletal: Positive for neck pain. Negative for back pain and joint swelling.  Neurological: Negative for weakness and numbness.      Allergies  Review of patient's allergies indicates no known allergies.  Home Medications   Prior to Admission medications   Medication Sig Start Date End Date Taking? Authorizing Provider  acetaminophen (TYLENOL) 500 MG tablet Take 500 mg by mouth every 6 (six)  hours as needed for moderate pain.    Historical Provider, MD  amoxicillin (AMOXIL) 500 MG capsule Take 500 mg by mouth 2 (two) times daily.    Historical Provider, MD  aspirin-acetaminophen-caffeine (EXCEDRIN MIGRAINE) [phone] MG per tablet Take 1 tablet by mouth every 6 (six) hours as needed for headache.    Historical Provider, MD  ciprofloxacin-hydrocortisone (CIPRO HC) otic suspension Place 3 drops into the left ear 2 (two) times daily. 12/18/13   Mora Bellman, PA-C  cyclobenzaprine (FLEXERIL) 10 MG tablet Take 1 tablet (10 mg total) by mouth 2 (two) times daily as needed for muscle spasms. 06/16/14   Vida Roller, MD  FLUoxetine (PROZAC) 20 MG capsule Take 20 mg by mouth daily.    Historical Provider, MD  HYDROcodone-acetaminophen (NORCO) 10-325 MG per tablet Take 1 tablet by mouth every 6 (six) hours as needed.    Historical Provider, MD  levETIRAcetam (KEPPRA) 1000 MG tablet Take 1,000 mg by mouth 2 (two) times daily.    Historical Provider, MD  naproxen (NAPROSYN) 500 MG tablet Take 1 tablet (500 mg total) by mouth 2 (two) times daily with a meal. 06/16/14   Vida Roller, MD   BP 109/48 mmHg  Pulse 73  Temp(Src) 98 F (36.7 C) (Oral)  Resp 18  Ht  (1.549 m)  Wt 115 lb (52.164 kg)  BMI 21.74 kg/m2  SpO2 100%  LMP 05/21/2014 Physical Exam  Constitutional: She appears well-developed and well-nourished.  No distress.  HENT:  Head: Normocephalic and atraumatic.  Eyes: Conjunctivae are normal. No scleral icterus.  Cardiovascular: Normal rate, regular rhythm and intact distal pulses.   Pulmonary/Chest: Effort normal and breath sounds normal.  Musculoskeletal: She exhibits tenderness ( Tender to palpation across the left trapezius muscles as well as the left deltoid and left triceps. Normal range of motion of all joints, supple joints and soft compartments, normal strength.). She exhibits no edema.  Neurological: She is alert.  Skin: Skin is warm and dry. No rash noted. She  is not diaphoretic.  Nursing note and vitals reviewed.   ED Course  Procedures (including critical care time) Labs Review Labs Reviewed - No data to display  Imaging Review No results found.   EKG Interpretation None      MDM   Final diagnoses:  Neck strain, initial encounter    Muscular tenderness, no other signs of acute injury, stable for discharge without imaging, anti-inflammatories and muscle relaxants.  Meds given in ED:  Medications  ketorolac (TORADOL) injection 60 mg (not administered)    New Prescriptions   CYCLOBENZAPRINE (FLEXERIL) 10 MG TABLET    Take 1 tablet (10 mg total) by mouth 2 (two) times daily as needed for muscle spasms.   NAPROXEN (NAPROSYN) 500 MG TABLET    Take 1 tablet (500 mg total) by mouth 2 (two) times daily with a meal.       Vida RollerBrian D Acy Orsak, MD 06/16/14 [phone]

## 2014-09-10 ENCOUNTER — Emergency Department (HOSPITAL_COMMUNITY): Payer: Medicaid - Out of State

## 2014-09-10 ENCOUNTER — Encounter (HOSPITAL_COMMUNITY): Payer: Self-pay | Admitting: *Deleted

## 2014-09-10 ENCOUNTER — Emergency Department (HOSPITAL_COMMUNITY)
Admission: EM | Admit: 2014-09-10 | Discharge: 2014-09-11 | Disposition: A | Discharge status: Home / Self Care | Payer: Medicaid - Out of State | Attending: Emergency Medicine | Admitting: Emergency Medicine

## 2014-09-10 DIAGNOSIS — R6883 Chills (without fever): Secondary | ICD-10-CM | POA: Insufficient documentation

## 2014-09-10 DIAGNOSIS — R531 Weakness: Secondary | ICD-10-CM | POA: Insufficient documentation

## 2014-09-10 DIAGNOSIS — G40909 Epilepsy, unspecified, not intractable, without status epilepticus: Secondary | ICD-10-CM | POA: Insufficient documentation

## 2014-09-10 DIAGNOSIS — Z3202 Encounter for pregnancy test, result negative: Secondary | ICD-10-CM | POA: Insufficient documentation

## 2014-09-10 DIAGNOSIS — F419 Anxiety disorder, unspecified: Secondary | ICD-10-CM | POA: Insufficient documentation

## 2014-09-10 DIAGNOSIS — Z791 Long term (current) use of non-steroidal anti-inflammatories (NSAID): Secondary | ICD-10-CM | POA: Insufficient documentation

## 2014-09-10 DIAGNOSIS — M542 Cervicalgia: Secondary | ICD-10-CM

## 2014-09-10 DIAGNOSIS — R110 Nausea: Secondary | ICD-10-CM | POA: Insufficient documentation

## 2014-09-10 DIAGNOSIS — F329 Major depressive disorder, single episode, unspecified: Secondary | ICD-10-CM | POA: Insufficient documentation

## 2014-09-10 DIAGNOSIS — Z720 Tobacco use: Secondary | ICD-10-CM | POA: Insufficient documentation

## 2014-09-10 MED ORDER — OXYCODONE-ACETAMINOPHEN 5-325 MG PO TABS
1.0000 | ORAL_TABLET | Freq: Once | ORAL | Status: AC
  Administered 2014-09-10: 1 via ORAL
  Filled 2014-09-10: qty 1

## 2014-09-10 MED ORDER — KETOROLAC TROMETHAMINE 60 MG/2ML IM SOLN
60.0000 mg | Freq: Once | INTRAMUSCULAR | Status: AC
  Administered 2014-09-10: 60 mg via INTRAMUSCULAR
  Filled 2014-09-10: qty 2

## 2014-09-10 MED ORDER — CYCLOBENZAPRINE HCL 10 MG PO TABS
10.0000 mg | ORAL_TABLET | Freq: Once | ORAL | Status: AC
  Administered 2014-09-10: 10 mg via ORAL
  Filled 2014-09-10: qty 1

## 2014-09-10 NOTE — ED Provider Notes (Signed)
CSN: [id number]     Arrival date & time 09/10/14  2219 History  This chart was scribed for Angelica Cuevas Hondo Nanda, MD by Karle PlumberJennifer Tensley, ED Scribe. This patient was seen in room APA03/[facility]03 and the patient's care was started at 11:06 PM.   Chief Complaint  Patient presents with  . Neck Pain   Patient is a 29 y.o. female presenting with neck pain. The history is provided by the patient and medical records. No language interpreter was used.  Neck Pain Associated symptoms: weakness   Associated symptoms: no chest pain, no fever, no headaches and no numbness     HPI Comments:  Angelica Cuevas is a 29 y.o. female who presents to the Emergency Department complaining of increasing, severe neck pain that started four days ago after receiving an epidural injection in her neck and reports increased pain since. She reports associated redness in her face and chest, mid to left sided back pain and inability to grasp with her right hand (dominant hand). She also reports intermittent pain and numbness of the RLE. Pt reports subjective chills, diaphoresis and nausea. She rates her pain at 10/10. She states she contacted the doctor twice since then and was told to come to the ED. She has a follow up appt in ten days and states she cannot wait until then. She reports taking a Lortab two days ago and a Roxicet 15 mg yesterday. She reports significant relief from the Roxicet. Touching the area or trying to move her neck makes the pain worse. Denies alleviating factors. Denies vomiting, bowel or bladder incontinence. PMHx of anxiety, depression and epilepsy. She takes Lamictal for her seizures. Denies any allergies to any medications. Endorses smoking daily.   Past Medical History  Diagnosis Date  . Anxiety   . Depression   . Seizures    Past Surgical History  Procedure Laterality Date  . Ectopic pregnancy surgery    . Laparoscopic salpingo oopherectomy    . Cesarean section    . Tubal ligation     History  reviewed. No pertinent family history. History  Substance Use Topics  . Smoking status: Current Every Day Smoker    Types: Cigarettes  . Smokeless tobacco: Not on file  . Alcohol Use: No   OB History    No data available     Review of Systems  Constitutional: Positive for chills. Negative for fever.  Respiratory: Negative for chest tightness and shortness of breath.   Cardiovascular: Negative for chest pain.  Gastrointestinal: Positive for nausea. Negative for vomiting and abdominal pain.  Genitourinary: Negative for dysuria.  Musculoskeletal: Positive for neck pain. Negative for back pain.  Skin: Negative for wound.  Neurological: Positive for weakness. Negative for numbness and headaches.  All other systems reviewed and are negative.   Allergies  Review of patient's allergies indicates no known allergies.  Home Medications   Prior to Admission medications   Medication Sig Start Date End Date Taking? Authorizing Provider  acetaminophen (TYLENOL) 500 MG tablet Take 500 mg by mouth every 6 (six) hours as needed for moderate pain.    Historical Provider, MD  amoxicillin (AMOXIL) 500 MG capsule Take 500 mg by mouth 2 (two) times daily.    Historical Provider, MD  aspirin-acetaminophen-caffeine (EXCEDRIN MIGRAINE) [phone] MG per tablet Take 1 tablet by mouth every 6 (six) hours as needed for headache.    Historical Provider, MD  ciprofloxacin-hydrocortisone (CIPRO HC) otic suspension Place 3 drops into the left ear 2 (two)  times daily. 12/18/13   Junious SilkHannah Merrell, PA-C  cyclobenzaprine (FLEXERIL) 10 MG tablet Take 1 tablet (10 mg total) by mouth 3 (three) times daily as needed for muscle spasms. 09/11/14   Angelica Cuevas Jaiona Simien, MD  FLUoxetine (PROZAC) 20 MG capsule Take 20 mg by mouth daily.    Historical Provider, MD  HYDROcodone-acetaminophen (NORCO) 10-325 MG per tablet Take 1 tablet by mouth every 6 (six) hours as needed.    Historical Provider, MD  ibuprofen (ADVIL,MOTRIN) 600 MG  tablet Take 1 tablet (600 mg total) by mouth every 6 (six) hours as needed. 09/11/14   Angelica Cuevas Kember Boch, MD  levETIRAcetam (KEPPRA) 1000 MG tablet Take 1,000 mg by mouth 2 (two) times daily.    Historical Provider, MD  naproxen (NAPROSYN) 500 MG tablet Take 1 tablet (500 mg total) by mouth 2 (two) times daily with a meal. 06/16/14   Eber HongBrian Miller, MD  oxyCODONE-acetaminophen (PERCOCET/ROXICET) 5-325 MG per tablet Take 1 tablet by mouth every 6 (six) hours as needed for severe pain. 09/11/14   Angelica Cuevas Kalecia Hartney, MD   Triage Vitals: BP 137/84 mmHg  Pulse 110  Temp(Src) 98.3 Cuevas (36.8 C) (Oral)  Resp 22  SpO2 100%  LMP 08/20/2014 Physical Exam  Constitutional: She is oriented to person, place, and time. She appears well-developed and well-nourished. No distress.  HENT:  Head: Normocephalic and atraumatic.  Eyes: Pupils are equal, round, and reactive to light.  Neck:  Decreased range of motion of the neck, tenderness to palpation over the midline and right paraspinous muscle region, no overlying skin changes  Cardiovascular: Normal rate, regular rhythm and normal heart sounds.   Pulmonary/Chest: Effort normal and breath sounds normal. No respiratory distress. She has no wheezes.  Musculoskeletal:  Holding right arm in a flexed position  Neurological: She is alert and oriented to person, place, and time.  4+ out of 5 grip strength on the right, give weakness secondary to pain, 5 out of 5 grip strength on the left, normal, 5 out of 5 strength in the bilateral lower extremities, no clonus, normal and symmetric reflexes  Skin: Skin is warm and dry.  Psychiatric: She has a normal mood and affect.  Nursing note and vitals reviewed.   ED Course  Procedures (including critical care time) DIAGNOSTIC STUDIES: Oxygen Saturation is 100% on RA, normal by my interpretation.   COORDINATION OF CARE: 11:13 PM- Will CT neck. Pt verbalizes understanding and agrees to plan.  Medications  cyclobenzaprine  (FLEXERIL) tablet 10 mg (10 mg Oral Given 09/10/14 2345)  ketorolac (TORADOL) injection 60 mg (60 mg Intramuscular Given 09/10/14 2346)  oxyCODONE-acetaminophen (PERCOCET/ROXICET) 5-325 MG per tablet 1 tablet (1 tablet Oral Given 09/10/14 2345)    Labs Review Labs Reviewed  PREGNANCY, URINE    Imaging Review Ct Cervical Spine Wo Contrast  09/11/2014   CLINICAL DATA:  Increasing severe neck pain starting 4 days ago after an epidural injection. Patient fell and hurt her neck on 02/2014. Fever, chills, diaphoresis, nausea.  EXAM: CT CERVICAL SPINE WITHOUT CONTRAST  TECHNIQUE: Multidetector CT imaging of the cervical spine was performed without intravenous contrast. Multiplanar CT image reconstructions were also generated.  COMPARISON:  Cervical spine films 01/06/2004  FINDINGS: Reversal of the usual cervical lordosis which may be due to patient positioning but ligamentous injury or muscle spasm could also have this appearance and are not excluded. Mild degenerative changes with narrowed interspace at C5-6 and mild endplate hypertrophic changes. Prominent osteophyte at this level causes effacement of  the left lateral recess. Intervertebral disc space heights are otherwise preserved. No vertebral compression deformities. No prevertebral soft tissue swelling. No focal bone lesion or bone destruction. Endplates appear intact. Bone cortex and trabecular architecture appear intact. Soft tissues are unremarkable.  IMPRESSION: Nonspecific reversal of the usual cervical lordosis. No acute bony abnormalities. Mild degenerative changes at C5-6.   Electronically Signed   By: Burman Nieves M.D.   On: 09/11/2014 00:39     EKG Interpretation None      MDM   Final diagnoses:  Neck pain    Patient presents with neck pain worsened following an epidural injection on Monday. Denies any fevers. Afebrile here.  Patient has give weakness on the right secondary to pain but otherwise is nonfocal. Does have decreased  range of motion of the neck. Prominent symptom is increased pain. Neurologic function appears to be intact. Patient was given muscle relaxants, pain medication, and Toradol. CT of the C-spine is negative. Discussed with patient MRI imaging. I do not have access to MRI at this time. Concern would be for fluid collection (infection/abscess versus hematoma) at the injection site along the spinal cord. Patient does not want to be transferred to [facility] at this time as she does not have childcare. She reports improvement after symptomatic treatment. She has increased range of motion and strength is improved on the right with pain management. Discussed with her that at this time I cannot rule out fluid collection. She understands that if there is a fluid collection or infection, it could result in permanent disability or death. She needs to return tomorrow for an MRI. She is willing to do this. I'm unable to order this as an outpatient and patient will have to come back through the ER. She understands this. Patient was given pain medication. She was given strict return precautions and will return tomorrow between the hours of 7 and 11 AM for MRI.  I personally performed the services described in this documentation, which was scribed in my presence. The recorded information has been reviewed and is accurate.    Angelica Baton, MD 09/11/14 [phone]

## 2014-09-10 NOTE — ED Notes (Signed)
Pt has bulging disc in neck and back Had epidural injection  On Monday. Since then has had increased pain in neck

## 2014-09-11 LAB — PREGNANCY, URINE: PREG TEST UR: NEGATIVE

## 2014-09-11 MED ORDER — OXYCODONE-ACETAMINOPHEN 5-325 MG PO TABS: 1.0000 | ORAL_TABLET | Freq: Four times a day (QID) | ORAL | Status: DC | PRN

## 2014-09-11 MED ORDER — CYCLOBENZAPRINE HCL 10 MG PO TABS: 10.0000 mg | ORAL_TABLET | Freq: Three times a day (TID) | ORAL | Status: DC | PRN

## 2014-09-11 MED ORDER — IBUPROFEN 600 MG PO TABS: 600.0000 mg | ORAL_TABLET | Freq: Four times a day (QID) | ORAL | Status: DC | PRN

## 2014-09-11 NOTE — ED Notes (Signed)
Discharge instructions given, pt demonstrated teach back and verbal understanding. No concerns voiced.  

## 2014-09-11 NOTE — Discharge Instructions (Signed)
You were seen today for worsening neck pain after epidural injection. Your CT scan is reassuring. However, definitive testing with MRI should be performed to rule out fluid collection including blood or infection. You know have no signs or symptoms of infection at this time. Your pain was improved with supportive care. You should return tomorrow for MRI. If you develop any new or worsening symptoms in the meantime, you should return for further evaluation immediately. You were offered MRI tonight at a neighboring facility but declined.  AFTER THE PROCEDURE   You may be monitored for a short time before you go home.  You may feel weakness or numbness in your arm or leg, which disappears within hours.  You may be allowed to eat, drink, and take your regular medicine.  You may have soreness at the site of the injection. Document Released: 07/24/2007 Document Revised: 12/17/2012 Document Reviewed: 10/03/2012 [facility] Patient Information 2015 [place]. This information is not intended to replace advice given to you by your health care provider. Make sure you discuss any questions you have with your health care provider.

## 2014-09-15 ENCOUNTER — Emergency Department (HOSPITAL_COMMUNITY)
Admission: EM | Admit: 2014-09-15 | Discharge: 2014-09-15 | Disposition: A | Discharge status: Home / Self Care | Payer: Medicaid - Out of State | Attending: Emergency Medicine | Admitting: Emergency Medicine

## 2014-09-15 ENCOUNTER — Emergency Department (HOSPITAL_COMMUNITY): Payer: Medicaid - Out of State

## 2014-09-15 ENCOUNTER — Encounter (HOSPITAL_COMMUNITY): Payer: Self-pay | Admitting: *Deleted

## 2014-09-15 ENCOUNTER — Other Ambulatory Visit (HOSPITAL_COMMUNITY): Payer: Self-pay | Admitting: Emergency Medicine

## 2014-09-15 DIAGNOSIS — R531 Weakness: Secondary | ICD-10-CM | POA: Diagnosis not present

## 2014-09-15 DIAGNOSIS — M542 Cervicalgia: Secondary | ICD-10-CM | POA: Diagnosis present

## 2014-09-15 DIAGNOSIS — G40909 Epilepsy, unspecified, not intractable, without status epilepticus: Secondary | ICD-10-CM | POA: Insufficient documentation

## 2014-09-15 DIAGNOSIS — M5412 Radiculopathy, cervical region: Secondary | ICD-10-CM | POA: Diagnosis not present

## 2014-09-15 DIAGNOSIS — R52 Pain, unspecified: Secondary | ICD-10-CM

## 2014-09-15 DIAGNOSIS — Z720 Tobacco use: Secondary | ICD-10-CM | POA: Insufficient documentation

## 2014-09-15 DIAGNOSIS — Z79899 Other long term (current) drug therapy: Secondary | ICD-10-CM | POA: Diagnosis not present

## 2014-09-15 DIAGNOSIS — F329 Major depressive disorder, single episode, unspecified: Secondary | ICD-10-CM | POA: Diagnosis not present

## 2014-09-15 DIAGNOSIS — F419 Anxiety disorder, unspecified: Secondary | ICD-10-CM | POA: Diagnosis not present

## 2014-09-15 MED ORDER — NAPROXEN 500 MG PO TABS: 500.0000 mg | ORAL_TABLET | Freq: Two times a day (BID) | ORAL | Status: DC

## 2014-09-15 MED ORDER — OXYCODONE-ACETAMINOPHEN 5-325 MG PO TABS
1.0000 | ORAL_TABLET | Freq: Once | ORAL | Status: AC
  Administered 2014-09-15: 1 via ORAL
  Filled 2014-09-15: qty 1

## 2014-09-15 MED ORDER — CYCLOBENZAPRINE HCL 10 MG PO TABS: 10.0000 mg | ORAL_TABLET | Freq: Three times a day (TID) | ORAL | Status: DC | PRN

## 2014-09-15 MED ORDER — GADOBENATE DIMEGLUMINE 529 MG/ML IV SOLN
10.0000 mL | Freq: Once | INTRAVENOUS | Status: AC | PRN
  Administered 2014-09-15: 10 mL via INTRAVENOUS

## 2014-09-15 NOTE — ED Notes (Signed)
Epidural shot in neck 1 week ago Monday and has had continuous R neck, shoulder and back pain since then.  Was seen here on 09/10/14.  MRI scheduled for 09/28/14, but has to get approval from insurance.  Here for continued pain.

## 2014-09-15 NOTE — ED Notes (Signed)
Pt waiting for MIR disc and then may leave. Pt has been given d/c instructions

## 2014-09-15 NOTE — ED Notes (Signed)
Pt to MRI before able to be medicated for pain. NAD.

## 2014-09-15 NOTE — ED Provider Notes (Signed)
CSN: [id number]     Arrival date & time 09/15/14  1422 History   First MD Initiated Contact with Patient 09/15/14 1502     Chief Complaint  Patient presents with  . Neck Pain     (Consider location/radiation/quality/duration/timing/severity/associated sxs/prior Treatment) HPI   Angelica Cuevas is a 29 y.o. female who presents to the Emergency Department complaining of persistent right sided neck pain.  She was seen here on 09/10/14 for same and was advised to return here next day for MRI of the cervical spine.  Patient states she was unable to due to she did not have anyone to watch her child.  She states the pain medication prescribed help her pain , but she has ran out.  She states that she just scheduled the MRI for 09/28/14.  She denies headache, fever, swelling, vomiting or visual changes.  Past Medical History  Diagnosis Date  . Anxiety   . Depression   . Seizures    Past Surgical History  Procedure Laterality Date  . Ectopic pregnancy surgery    . Laparoscopic salpingo oopherectomy    . Cesarean section    . Tubal ligation     History reviewed. No pertinent family history. History  Substance Use Topics  . Smoking status: Current Every Day Smoker -- 1.50 packs/day    Types: Cigarettes  . Smokeless tobacco: Not on file  . Alcohol Use: No   OB History    No data available     Review of Systems  Constitutional: Negative for fever.  Respiratory: Negative for shortness of breath.   Gastrointestinal: Negative for vomiting, abdominal pain and constipation.  Genitourinary: Negative for dysuria, hematuria, flank pain, decreased urine volume and difficulty urinating.  Musculoskeletal: Positive for neck pain. Negative for back pain, joint swelling and arthralgias.  Skin: Negative for rash.  Neurological: Positive for weakness. Negative for dizziness, syncope, speech difficulty, numbness and headaches.  All other systems reviewed and are negative.     Allergies  Review of  patient's allergies indicates no known allergies.  Home Medications   Prior to Admission medications   Medication Sig Start Date End Date Taking? Authorizing Provider  ALPRAZolam Prudy Feeler(XANAX) 1 MG tablet Take 1 mg by mouth 2 (two) times daily.   Yes Historical Provider, MD  lamoTRIgine (LAMICTAL) 100 MG tablet Take 100 mg by mouth 2 (two) times daily.   Yes Historical Provider, MD  acetaminophen (TYLENOL) 500 MG tablet Take 500 mg by mouth every 6 (six) hours as needed for moderate pain.    Historical Provider, MD  aspirin-acetaminophen-caffeine (EXCEDRIN MIGRAINE) [phone] MG per tablet Take 1 tablet by mouth every 6 (six) hours as needed for headache.    Historical Provider, MD  ciprofloxacin-hydrocortisone (CIPRO HC) otic suspension Place 3 drops into the left ear 2 (two) times daily. Patient not taking: Reported on 09/15/2014 12/18/13   Junious SilkHannah Merrell, PA-C  cyclobenzaprine (FLEXERIL) 10 MG tablet Take 1 tablet (10 mg total) by mouth 3 (three) times daily as needed for muscle spasms. Patient not taking: Reported on 09/15/2014 09/11/14   Shon Batonourtney F Horton, MD  ibuprofen (ADVIL,MOTRIN) 600 MG tablet Take 1 tablet (600 mg total) by mouth every 6 (six) hours as needed. Patient not taking: Reported on 09/15/2014 09/11/14   Shon Batonourtney F Horton, MD  naproxen (NAPROSYN) 500 MG tablet Take 1 tablet (500 mg total) by mouth 2 (two) times daily with a meal. Patient not taking: Reported on 09/15/2014 06/16/14   Eber HongBrian Miller, MD  oxyCODONE-acetaminophen (  PERCOCET/ROXICET) 5-325 MG per tablet Take 1 tablet by mouth every 6 (six) hours as needed for severe pain. Patient not taking: Reported on 09/15/2014 09/11/14   Shon Baton, MD   BP 112/78 mmHg  Pulse 87  Temp(Src) 98.9 F (37.2 C) (Oral)  Resp 16  Ht  (1.549 m)  Wt 113 lb (51.256 kg)  BMI 21.36 kg/m2  SpO2 100%  LMP 08/20/2014 Physical Exam  Constitutional: She is oriented to person, place, and time. She appears well-developed and well-nourished.  No distress.  HENT:  Head: Normocephalic and atraumatic.  Mouth/Throat: Oropharynx is clear and moist.  Eyes: EOM are normal. Pupils are equal, round, and reactive to light.  Neck: Phonation normal. Muscular tenderness present. No spinous process tenderness present. No rigidity. Decreased range of motion present. No erythema present. No Brudzinski's sign and no Kernig's sign noted. No thyromegaly present.  ttp of the right cervical spine and paraspinal muscles and along the right trapezius muscle.  Grip strength 3/5 of right, 5/5 on left. No clonus.  Distal sensation intact,  CR < 2 sec.  Pain reproduced with abduction of the right arm. Radial pulse strong    Cardiovascular: Normal rate, regular rhythm, normal heart sounds and intact distal pulses.   No murmur heard. Pulmonary/Chest: Effort normal and breath sounds normal. No respiratory distress. She exhibits no tenderness.  Musculoskeletal: She exhibits tenderness. She exhibits no edema.       Cervical back: She exhibits tenderness. She exhibits normal range of motion, no bony tenderness, no swelling, no deformity, no spasm and normal pulse.  See neck exam  Lymphadenopathy:    She has no cervical adenopathy.  Neurological: She is alert and oriented to person, place, and time. She has normal strength. No sensory deficit. She exhibits normal muscle tone. Coordination normal.  Reflex Scores:      Tricep reflexes are 2+ on the right side and 2+ on the left side.      Bicep reflexes are 2+ on the right side and 2+ on the left side. Skin: Skin is warm and dry.  Nursing note and vitals reviewed.   ED Course  Procedures (including critical care time) Labs Review Labs Reviewed - No data to display  Imaging Review Mr Cervical Spine W Wo Contrast  09/15/2014   CLINICAL DATA:  Neck pain radiating to the right arm, worse after a steroid injection last week.  EXAM: MRI CERVICAL SPINE WITHOUT AND WITH CONTRAST  TECHNIQUE: Multiplanar and multiecho  pulse sequences of the cervical spine, to include the craniocervical junction and cervicothoracic junction, were obtained according to standard protocol without and with intravenous contrast.  CONTRAST:  10mL MULTIHANCE GADOBENATE DIMEGLUMINE 529 MG/ML IV SOLN  COMPARISON:  Cervical spine CT 09/11/2014  FINDINGS: There straightening of the normal cervical lordosis. There is no listhesis. Vertebral body heights are preserved. Mild disc space narrowing is again seen at C5-6. No vertebral marrow edema is seen. Craniocervical junction is unremarkable. 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 Cervical spinal cord is normal in caliber and signal. No abnormal enhancement or fluid collection is identified. Paraspinal soft tissues are unremarkable.  C2-3:  Negative.  C3-4:  Minimal left uncovertebral spurring without stenosis.  C4-5:  Negative.  C5-6: There is a broad disc extrusion extending from a left paracentral location into the proximal aspect of the left neural foramen and resulting in mild left neural foraminal stenosis. This partially effaces the left ventral thecal sac without significant  spinal stenosis. A small annular fissure is noted.  C6-7:  Negative.  C7-T1:  Negative.  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   Electronically Signed   By: Sebastian AcheAllen  Grady   On: 09/15/2014 16:56     EKG Interpretation None      MDM   Final diagnoses:  Cervical radicular pain   Pt is well appearing.  Right sided UE weakness due to level of pain.  MRI of cervical spine does not show evidence of abscess or HNP.  Pt agrees to f/u with [place] in [place].  Appears stable for d/c.  Understands that further narcotics are not indicated at this time. Given refarral info.  Appears stable for discharge     Pauline Ausammy Vickey Ewbank, Cordelia Poche-C 09/17/14 2321  Bethann BerkshireJoseph Zammit, MD 09/18/14 [phone]

## 2014-09-15 NOTE — Discharge Instructions (Signed)
Cervical Radiculopathy °Cervical radiculopathy means a nerve in the neck is pinched or bruised. This can cause pain or loss of feeling (numbness) that runs from your neck to your arm and fingers. °HOME CARE  °· Put ice on the injured or painful area. °¨ Put ice in a plastic bag. °¨ Place a towel between your skin and the bag. °¨ Leave the ice on for 15-20 minutes, 03-04 times a day, or as told by your doctor. °· If ice does not help, you can try using heat. Take a warm shower or bath, or use a hot water bottle as told by your doctor. °· You may try a gentle neck and shoulder massage. °· Use a flat pillow when you sleep. °· Only take medicines as told by your doctor. °· Keep all physical therapy visits as told by your doctor. °· If you are given a soft collar, wear it as told by your doctor. °GET HELP RIGHT AWAY IF:  °· Your pain gets worse and is not controlled with medicine. °· You lose feeling or feel weak in your hand, arm, face, or leg. °· You have a fever or stiff neck. °· You cannot control when you poop or pee (incontinence). °· You have trouble with walking, balance, or speaking. °MAKE SURE YOU:  °· Understand these instructions. °· Will watch your condition. °· Will get help right away if you are not doing well or get worse. °Document Released: 04/05/2011 Document Revised: 07/09/2011 Document Reviewed: 04/05/2011 °ExitCare® Patient Information ©2015 [employer]. This information is not intended to replace advice given to you by your health care provider. Make sure you discuss any questions you have with your health care provider. ° °

## 2014-09-28 ENCOUNTER — Other Ambulatory Visit (HOSPITAL_COMMUNITY): Payer: Medicaid - Out of State

## 2015-02-14 ENCOUNTER — Emergency Department (HOSPITAL_COMMUNITY)
Admission: EM | Admit: 2015-02-14 | Discharge: 2015-02-14 | Disposition: A | Discharge status: Home / Self Care | Payer: Medicaid - Out of State | Attending: Emergency Medicine | Admitting: Emergency Medicine

## 2015-02-14 ENCOUNTER — Encounter (HOSPITAL_COMMUNITY): Payer: Self-pay

## 2015-02-14 DIAGNOSIS — F329 Major depressive disorder, single episode, unspecified: Secondary | ICD-10-CM | POA: Diagnosis not present

## 2015-02-14 DIAGNOSIS — K029 Dental caries, unspecified: Secondary | ICD-10-CM | POA: Diagnosis not present

## 2015-02-14 DIAGNOSIS — Z720 Tobacco use: Secondary | ICD-10-CM | POA: Diagnosis not present

## 2015-02-14 DIAGNOSIS — F419 Anxiety disorder, unspecified: Secondary | ICD-10-CM | POA: Insufficient documentation

## 2015-02-14 DIAGNOSIS — Z79899 Other long term (current) drug therapy: Secondary | ICD-10-CM | POA: Insufficient documentation

## 2015-02-14 DIAGNOSIS — G40909 Epilepsy, unspecified, not intractable, without status epilepticus: Secondary | ICD-10-CM | POA: Insufficient documentation

## 2015-02-14 DIAGNOSIS — R0989 Other specified symptoms and signs involving the circulatory and respiratory systems: Secondary | ICD-10-CM | POA: Diagnosis not present

## 2015-02-14 DIAGNOSIS — Z791 Long term (current) use of non-steroidal anti-inflammatories (NSAID): Secondary | ICD-10-CM | POA: Diagnosis not present

## 2015-02-14 DIAGNOSIS — K0889 Other specified disorders of teeth and supporting structures: Secondary | ICD-10-CM | POA: Diagnosis present

## 2015-02-14 DIAGNOSIS — R05 Cough: Secondary | ICD-10-CM | POA: Insufficient documentation

## 2015-02-14 MED ORDER — ACETAMINOPHEN-CODEINE #3 300-30 MG PO TABS
1.0000 | ORAL_TABLET | Freq: Once | ORAL | Status: AC
  Administered 2015-02-14: 1 via ORAL
  Filled 2015-02-14: qty 1

## 2015-02-14 MED ORDER — IBUPROFEN 800 MG PO TABS
800.0000 mg | ORAL_TABLET | Freq: Once | ORAL | Status: AC
  Administered 2015-02-14: 800 mg via ORAL
  Filled 2015-02-14: qty 1

## 2015-02-14 MED ORDER — AMOXICILLIN 250 MG PO CAPS
500.0000 mg | ORAL_CAPSULE | Freq: Once | ORAL | Status: AC
  Administered 2015-02-14: 500 mg via ORAL
  Filled 2015-02-14: qty 2

## 2015-02-14 MED ORDER — AMOXICILLIN 500 MG PO CAPS: 500.0000 mg | ORAL_CAPSULE | Freq: Three times a day (TID) | ORAL | Status: AC

## 2015-02-14 MED ORDER — ACETAMINOPHEN-CODEINE #3 300-30 MG PO TABS: 1.0000 | ORAL_TABLET | Freq: Four times a day (QID) | ORAL | Status: AC | PRN

## 2015-02-14 MED ORDER — ACETAMINOPHEN 325 MG PO TABS
650.0000 mg | ORAL_TABLET | Freq: Once | ORAL | Status: DC
  Filled 2015-02-14: qty 2

## 2015-02-14 NOTE — ED Notes (Signed)
PA at bedside.

## 2015-02-14 NOTE — ED Provider Notes (Signed)
CSN: [id number]     Arrival date & time 02/14/15  1936 History  By signing my name below, I, Angelica Cuevas, attest that this documentation has been prepared under the direction and in the presence of Angelica Quale, PA-C.  Electronically Signed: Tanda Cuevas, ED Scribe. 02/14/2015. 8:06 PM.  Chief Complaint  Patient presents with  . Dental Pain   The history is provided by the patient. No language interpreter was used.     HPI Comments: Angelica Cuevas is a 29 y.o. female who presents to the Emergency Department complaining of gradual onset, constant, severe, left upper and lower dental pain x 2 weeks. The pain is exacerbated with heat and cold. Pt is having difficulty talking and mild difficulty swallowing due to the pain. She is able to tolerate secretions well. Denies fever, chills, or any other associated symptoms. She has been trying to get in to see a dentist but states with her Medicaid she can only see the dentist on Fridays. She will try to go this Friday, approximately 4 days from now. Pt is current every day smoker. LNMP: 4 days ago. Denies risk of being pregnant. Pt is not currently breastfeeding.    Past Medical History  Diagnosis Date  . Anxiety   . Depression   . Seizures [facility])    Past Surgical History  Procedure Laterality Date  . Ectopic pregnancy surgery    . Laparoscopic salpingo oopherectomy    . Cesarean section    . Tubal ligation     No family history on file. Social History  Substance Use Topics  . Smoking status: Current Every Day Smoker -- 1.50 packs/day    Types: Cigarettes  . Smokeless tobacco: None  . Alcohol Use: No   OB History    No data available     Review of Systems  Constitutional: Negative for fever and chills.  HENT: Positive for dental problem and trouble swallowing.   All other systems reviewed and are negative.  Allergies  Review of patient's allergies indicates no known allergies.  Home Medications   Prior to Admission  medications   Medication Sig Start Date End Date Taking? Authorizing Provider  acetaminophen (TYLENOL) 500 MG tablet Take 500 mg by mouth every 6 (six) hours as needed for moderate pain.    Historical Provider, MD  ALPRAZolam Prudy Feeler) 1 MG tablet Take 1 mg by mouth 2 (two) times daily.    Historical Provider, MD  aspirin-acetaminophen-caffeine (EXCEDRIN MIGRAINE) [phone] MG per tablet Take 1 tablet by mouth every 6 (six) hours as needed for headache.    Historical Provider, MD  ciprofloxacin-hydrocortisone (CIPRO HC) otic suspension Place 3 drops into the left ear 2 (two) times daily. Patient not taking: Reported on 09/15/2014 12/18/13   Junious Silk, PA-C  cyclobenzaprine (FLEXERIL) 10 MG tablet Take 1 tablet (10 mg total) by mouth 3 (three) times daily as needed. 09/15/14   Tammy Triplett, PA-C  ibuprofen (ADVIL,MOTRIN) 600 MG tablet Take 1 tablet (600 mg total) by mouth every 6 (six) hours as needed. Patient not taking: Reported on 09/15/2014 09/11/14   Shon Baton, MD  lamoTRIgine (LAMICTAL) 100 MG tablet Take 100 mg by mouth 2 (two) times daily.    Historical Provider, MD  naproxen (NAPROSYN) 500 MG tablet Take 1 tablet (500 mg total) by mouth 2 (two) times daily with a meal. 09/15/14   Tammy Triplett, PA-C  oxyCODONE-acetaminophen (PERCOCET/ROXICET) 5-325 MG per tablet Take 1 tablet by mouth every 6 (six) hours as  needed for severe pain. Patient not taking: Reported on 09/15/2014 09/11/14   Shon Batonourtney F Horton, MD   Triage Vitals: BP 110/70 mmHg  Pulse 79  Temp(Src) 99.1 F (37.3 C) (Oral)  Resp 18  Ht 5\' 1"  (1.549 m)  Wt 111 lb (50.349 kg)  BMI 20.98 kg/m2  SpO2 99%  LMP 02/10/2015   Physical Exam  Constitutional: She is oriented to person, place, and time. She appears well-developed and well-nourished. No distress.  HENT:  Head: Normocephalic and atraumatic.  Face is symmetrical No temperature changes to the face Cavities of the left lower jaw; No visible abscess No swelling  under the tongue Airway is patent Uvula is midline Cavities of the upper jaw; No visible abscess   Eyes: Conjunctivae and EOM are normal.  Neck: Neck supple. No tracheal deviation present.  Tightness and tenseness of the upper trapezius No palpable lymph nodes  Cardiovascular: Normal rate.   Pulmonary/Chest: Effort normal. No respiratory distress. She has no wheezes. She has rhonchi. She has no rales.  Scattered rhonchi Some clear with cough  Musculoskeletal: Normal range of motion.  Neurological: She is alert and oriented to person, place, and time.  Skin: Skin is warm and dry.  Psychiatric: She has a normal mood and affect. Her behavior is normal.  Nursing note and vitals reviewed.   ED Course  Procedures (including critical care time)  DIAGNOSTIC STUDIES: Oxygen Saturation is 99% on RA, normal by my interpretation.    COORDINATION OF CARE: 8:04 PM-Discussed treatment plan which includes rx antibiotics with pt at bedside and pt agreed to plan.   Labs Review Labs Reviewed - No data to display  Imaging Review No results found.    EKG Interpretation None      MDM  Vital signs reviewed. Exam favors dental pain related to dental caries. No evidence for Ludwig's Angina. No visible abscess present. Plan - Rx for amoxil and tylenol codeine. Pt strongly encouraged to see a dentist as soon as possible.   Final diagnoses:  Pain, dental  Dental caries    **I personally performed the services described in this documentation, which was scribed in my presence. The recorded information has been reviewed and is accurate.*  I have reviewed nursing notes, vital signs, and all appropriate lab and imaging results for this patient.   Angelica QualeHobson Ryane Konieczny, PA-C 02/15/15 2003  Lavera Guiseana Duo Liu, MD 02/16/15 [phone]

## 2015-02-14 NOTE — ED Notes (Signed)
Reports of left upper/lower dental pain x2 weeks. C/o pain radiating into left ear. Denies fever/chills.

## 2015-02-14 NOTE — Discharge Instructions (Signed)
Please use Amoxil 3 times daily with food.  use ibuprofen with breakfast, lunch, dinner, and at bedtime. Use Tylenol codeine for severe pain. This medication may cause drowsiness, please use with caution. Please see a dentist as sone as possible. Dental Caries Dental caries is tooth decay. This decay can cause a hole in teeth (cavity) that can get bigger and deeper over time. HOME CARE  Brush and floss your teeth. Do this at least two times a day.  Use a fluoride toothpaste.  Use a mouth rinse if told by your dentist or doctor.  Eat less sugary and starchy foods. Drink less sugary drinks.  Avoid snacking often on sugary and starchy foods. Avoid sipping often on sugary drinks.  Keep regular checkups and cleanings with your dentist.  Use fluoride supplements if told by your dentist or doctor.  Allow fluoride to be applied to teeth if told by your dentist or doctor.   This information is not intended to replace advice given to you by your health care provider. Make sure you discuss any questions you have with your health care provider.   Document Released: 01/24/2008 Document Revised: 05/07/2014 Document Reviewed: 04/18/2012 Elsevier Interactive Patient Education [employer]2016 [employer]

## 2015-03-18 ENCOUNTER — Encounter (HOSPITAL_COMMUNITY): Payer: Self-pay

## 2015-03-18 ENCOUNTER — Emergency Department (HOSPITAL_COMMUNITY): Payer: Medicaid - Out of State

## 2015-03-18 ENCOUNTER — Emergency Department (HOSPITAL_COMMUNITY)
Admission: EM | Admit: 2015-03-18 | Discharge: 2015-03-18 | Disposition: A | Discharge status: Home / Self Care | Payer: Medicaid - Out of State | Attending: Emergency Medicine | Admitting: Emergency Medicine

## 2015-03-18 DIAGNOSIS — K5900 Constipation, unspecified: Secondary | ICD-10-CM | POA: Insufficient documentation

## 2015-03-18 DIAGNOSIS — R109 Unspecified abdominal pain: Secondary | ICD-10-CM

## 2015-03-18 DIAGNOSIS — Z79899 Other long term (current) drug therapy: Secondary | ICD-10-CM | POA: Insufficient documentation

## 2015-03-18 DIAGNOSIS — Z791 Long term (current) use of non-steroidal anti-inflammatories (NSAID): Secondary | ICD-10-CM | POA: Diagnosis not present

## 2015-03-18 DIAGNOSIS — F329 Major depressive disorder, single episode, unspecified: Secondary | ICD-10-CM | POA: Diagnosis not present

## 2015-03-18 DIAGNOSIS — Z792 Long term (current) use of antibiotics: Secondary | ICD-10-CM | POA: Diagnosis not present

## 2015-03-18 DIAGNOSIS — G8929 Other chronic pain: Secondary | ICD-10-CM | POA: Insufficient documentation

## 2015-03-18 DIAGNOSIS — R1084 Generalized abdominal pain: Secondary | ICD-10-CM | POA: Diagnosis present

## 2015-03-18 DIAGNOSIS — Z3202 Encounter for pregnancy test, result negative: Secondary | ICD-10-CM | POA: Diagnosis not present

## 2015-03-18 DIAGNOSIS — F17210 Nicotine dependence, cigarettes, uncomplicated: Secondary | ICD-10-CM | POA: Diagnosis not present

## 2015-03-18 DIAGNOSIS — F419 Anxiety disorder, unspecified: Secondary | ICD-10-CM | POA: Insufficient documentation

## 2015-03-18 HISTORY — DX: Headache: R51

## 2015-03-18 HISTORY — DX: Other chronic pain: G89.29

## 2015-03-18 HISTORY — DX: Cervicalgia: M54.2

## 2015-03-18 HISTORY — DX: Headache, unspecified: R51.9

## 2015-03-18 LAB — COMPREHENSIVE METABOLIC PANEL
ALBUMIN: 4.3 g/dL (ref 3.5–5.0)
ALK PHOS: 36 U/L — AB (ref 38–126)
ALT: 14 U/L (ref 14–54)
ANION GAP: 6 (ref 5–15)
AST: 15 U/L (ref 15–41)
BUN: 7 mg/dL (ref 6–20)
CO2: 27 mmol/L (ref 22–32)
Calcium: 9.2 mg/dL (ref 8.9–10.3)
Chloride: 106 mmol/L (ref 101–111)
Creatinine, Ser: 0.88 mg/dL (ref 0.44–1.00)
GFR calc Af Amer: >60 mL/min (ref >60)
GFR calc non Af Amer: >60 mL/min (ref >60)
GLUCOSE: 82 mg/dL (ref 65–99)
POTASSIUM: 3.3 mmol/L — AB (ref 3.5–5.1)
SODIUM: 139 mmol/L (ref 135–145)
Total Bilirubin: 0.4 mg/dL (ref 0.3–1.2)
Total Protein: 7.1 g/dL (ref 6.5–8.1)

## 2015-03-18 LAB — CBC WITH DIFFERENTIAL/PLATELET
Basophils Absolute: 0 10*3/uL (ref 0.0–0.1)
Basophils Relative: 0 %
Eosinophils Absolute: 0.1 10*3/uL (ref 0.0–0.7)
Eosinophils Relative: 1 %
HEMATOCRIT: 34.7 % — AB (ref 36.0–46.0)
HEMOGLOBIN: 12.1 g/dL (ref 12.0–15.0)
LYMPHS ABS: 3.7 10*3/uL (ref 0.7–4.0)
Lymphocytes Relative: 39 %
MCH: 32.7 pg (ref 26.0–34.0)
MCHC: 34.9 g/dL (ref 30.0–36.0)
MCV: 93.8 fL (ref 78.0–100.0)
MONOS PCT: 6 %
Monocytes Absolute: 0.5 10*3/uL (ref 0.1–1.0)
NEUTROS ABS: 5.1 10*3/uL (ref 1.7–7.7)
NEUTROS PCT: 54 %
Platelets: 242 10*3/uL (ref 150–400)
RBC: 3.7 MIL/uL — ABNORMAL LOW (ref 3.87–5.11)
RDW: 12.7 % (ref 11.5–15.5)
WBC: 9.5 10*3/uL (ref 4.0–10.5)

## 2015-03-18 LAB — URINALYSIS, ROUTINE W REFLEX MICROSCOPIC
BILIRUBIN URINE: NEGATIVE
Glucose, UA: NEGATIVE mg/dL
Ketones, ur: NEGATIVE mg/dL
Leukocytes, UA: NEGATIVE
Nitrite: NEGATIVE
PH: 6 (ref 5.0–8.0)
Protein, ur: NEGATIVE mg/dL

## 2015-03-18 LAB — URINE MICROSCOPIC-ADD ON

## 2015-03-18 LAB — PREGNANCY, URINE: Preg Test, Ur: NEGATIVE

## 2015-03-18 LAB — LIPASE, BLOOD: Lipase: 26 U/L (ref 11–51)

## 2015-03-18 MED ORDER — IOHEXOL 300 MG/ML  SOLN
100.0000 mL | Freq: Once | INTRAMUSCULAR | Status: AC | PRN
  Administered 2015-03-18: 100 mL via INTRAVENOUS

## 2015-03-18 MED ORDER — KETOROLAC TROMETHAMINE 30 MG/ML IJ SOLN
30.0000 mg | Freq: Once | INTRAMUSCULAR | Status: AC
  Administered 2015-03-18: 30 mg via INTRAVENOUS
  Filled 2015-03-18: qty 1

## 2015-03-18 MED ORDER — SODIUM CHLORIDE 0.9 % IJ SOLN
INTRAMUSCULAR | Status: AC
  Filled 2015-03-18: qty 500

## 2015-03-18 MED ORDER — SODIUM CHLORIDE 0.9 % IV SOLN
INTRAVENOUS | Status: DC
  Administered 2015-03-18: 20:00:00 via INTRAVENOUS

## 2015-03-18 MED ORDER — BARIUM SULFATE 2 % PO SUSP: 450.0000 mL | Freq: Once | ORAL | Status: DC

## 2015-03-18 MED ORDER — DICYCLOMINE HCL 10 MG/ML IM SOLN
20.0000 mg | Freq: Once | INTRAMUSCULAR | Status: AC
  Administered 2015-03-18: 20 mg via INTRAMUSCULAR
  Filled 2015-03-18: qty 2

## 2015-03-18 NOTE — Discharge Instructions (Signed)
Follow up with your family md or the stomach specialist dr. Kendell Banerourke

## 2015-03-18 NOTE — ED Notes (Signed)
Pt reports [facility] and was given a bottle of mag citrate.  Reports no results so then pt drank castor oil.  Pt says had watery bm but not really any stool.  Pt c/o abd pain today.

## 2015-03-18 NOTE — ED Notes (Signed)
Pt in restroom 

## 2015-03-18 NOTE — ED Provider Notes (Signed)
CSN: [id number]     Arrival date & time 03/18/15  1750 History   First MD Initiated Contact with Patient 03/18/15 1846     Chief Complaint  Patient presents with  . Constipation  . Abdominal Pain      HPI  Pt was seen at 1855. Per pt, c/o gradual onset and persistence of constant generalized abd "pain" that began today. Pt states she was evaluated at OSH 2 days ago for "not having a BM." States she was told she "had 8 feet of colon blocked," was dx with "constipation," and rx mag citrate. Pt states she took the medicine "without passing any stool at all." Pt then drank castor oil and "still didn't pass any stool." Pt states she "has not passed any liquid, stool, or gas" from her rectum today. Denies back pain, no N/V/D, no CP/SOB, no fevers, no dysuria/hematuria, no vaginal bleeding/discharge.    Past Medical History  Diagnosis Date  . Anxiety   . Depression   . Seizures (HCC)   . Headache   . Chronic neck pain    Past Surgical History  Procedure Laterality Date  . Ectopic pregnancy surgery    . Laparoscopic salpingo oopherectomy Left   . Cesarean section    . Tubal ligation      Social History  Substance Use Topics  . Smoking status: Current Every Day Smoker -- 1.50 packs/day    Types: Cigarettes  . Smokeless tobacco: None  . Alcohol Use: No    Review of Systems ROS: Statement: All systems negative except as marked or noted in the HPI; Constitutional: Negative for fever and chills. ; ; Eyes: Negative for eye pain, redness and discharge. ; ; ENMT: Negative for ear pain, hoarseness, nasal congestion, sinus pressure and sore throat. ; ; Cardiovascular: Negative for chest pain, palpitations, diaphoresis, dyspnea and peripheral edema. ; ; Respiratory: Negative for cough, wheezing and stridor. ; ; Gastrointestinal: +abd pain, constipation. Negative for nausea, vomiting, diarrhea, blood in stool, hematemesis, jaundice and rectal bleeding. . ; ; Genitourinary: Negative for dysuria,  flank pain and hematuria. ; ; Musculoskeletal: Negative for back pain and neck pain. Negative for swelling and trauma.; ; Skin: Negative for pruritus, rash, abrasions, blisters, bruising and skin lesion.; ; Neuro: Negative for headache, lightheadedness and neck stiffness. Negative for weakness, altered level of consciousness , altered mental status, extremity weakness, paresthesias, involuntary movement, seizure and syncope.      Allergies  Review of patient's allergies indicates no known allergies.  Home Medications   Prior to Admission medications   Medication Sig Start Date End Date Taking? Authorizing Provider  acetaminophen (TYLENOL) 500 MG tablet Take 500 mg by mouth every 6 (six) hours as needed for moderate pain.    Historical Provider, MD  acetaminophen-codeine (TYLENOL #3) 300-30 MG tablet Take 1-2 tablets by mouth every 6 (six) hours as needed for moderate pain. 02/14/15   Ivery Quale, PA-C  ALPRAZolam Prudy Feeler) 1 MG tablet Take 1 mg by mouth 2 (two) times daily.    Historical Provider, MD  amoxicillin (AMOXIL) 500 MG capsule Take 1 capsule (500 mg total) by mouth 3 (three) times daily. 02/14/15   Ivery Quale, PA-C  aspirin-acetaminophen-caffeine (EXCEDRIN MIGRAINE) [phone] MG per tablet Take 1 tablet by mouth every 6 (six) hours as needed for headache.    Historical Provider, MD  ciprofloxacin-hydrocortisone (CIPRO HC) otic suspension Place 3 drops into the left ear 2 (two) times daily. Patient not taking: Reported on 09/15/2014 12/18/13  Junious Silk, PA-C  cyclobenzaprine (FLEXERIL) 10 MG tablet Take 1 tablet (10 mg total) by mouth 3 (three) times daily as needed. 09/15/14   Tammy Triplett, PA-C  ibuprofen (ADVIL,MOTRIN) 600 MG tablet Take 1 tablet (600 mg total) by mouth every 6 (six) hours as needed. Patient not taking: Reported on 09/15/2014 09/11/14   Shon Baton, MD  lamoTRIgine (LAMICTAL) 100 MG tablet Take 100 mg by mouth 2 (two) times daily.    Historical Provider,  MD  naproxen (NAPROSYN) 500 MG tablet Take 1 tablet (500 mg total) by mouth 2 (two) times daily with a meal. 09/15/14   Tammy Triplett, PA-C  oxyCODONE-acetaminophen (PERCOCET/ROXICET) 5-325 MG per tablet Take 1 tablet by mouth every 6 (six) hours as needed for severe pain. Patient not taking: Reported on 09/15/2014 09/11/14   Shon Baton, MD   BP 131/75 mmHg  Pulse 90  Temp(Src) 97.4 F (36.3 C) (Oral)  Resp 13  Ht  (1.549 m)  Wt 111 lb (50.349 kg)  BMI 20.98 kg/m2  SpO2 100%  LMP 03/13/2015 Physical Exam  1900: Physical examination:  Nursing notes reviewed; Vital signs and O2 SAT reviewed;  Constitutional: Well developed, Well nourished, Well hydrated, In no acute distress; Head:  Normocephalic, atraumatic; Eyes: EOMI, PERRL, No scleral icterus; ENMT: Mouth and pharynx normal, Mucous membranes moist; Neck: Supple, Full range of motion, No lymphadenopathy; Cardiovascular: Regular rate and rhythm, No murmur, rub, or gallop; Respiratory: Breath sounds clear & equal bilaterally, No rales, rhonchi, wheezes.  Speaking full sentences with ease, Normal respiratory effort/excursion; Chest: Nontender, Movement normal; Abdomen: Soft, +diffuse tenderness to palp, esp RLQ. No rebound or guarding. Nondistended, Hyperactive bowel sounds; Genitourinary: No CVA tenderness; Extremities: Pulses normal, No tenderness, No edema, No calf edema or asymmetry.; Neuro: AA&Ox3, Major CN grossly intact.  Speech clear. No gross focal motor or sensory deficits in extremities.; Skin: Color normal, Warm, Dry.   ED Course  Procedures (including critical care time) Labs Review   Imaging Review  I have personally reviewed and evaluated these images and lab results as part of my medical decision-making.   EKG Interpretation None      MDM  MDM Reviewed: nursing note, vitals and previous chart Reviewed previous: labs Interpretation: labs and x-ray      Results for orders placed or performed during the  hospital encounter of 03/18/15  Comprehensive metabolic panel  Result Value Ref Range   Sodium 139 135 - 145 mmol/L   Potassium 3.3 (L) 3.5 - 5.1 mmol/L   Chloride 106 101 - 111 mmol/L   CO2 27 22 - 32 mmol/L   Glucose, Bld 82 65 - 99 mg/dL   BUN 7 6 - 20 mg/dL   Creatinine, Ser 4.09 0.44 - 1.00 mg/dL   Calcium 9.2 8.9 - 81.1 mg/dL   Total Protein 7.1 6.5 - 8.1 g/dL   Albumin 4.3 3.5 - 5.0 g/dL   AST 15 15 - 41 U/L   ALT 14 14 - 54 U/L   Alkaline Phosphatase 36 (L) 38 - 126 U/L   Total Bilirubin 0.4 0.3 - 1.2 mg/dL   GFR calc non Af Amer >60 >60 mL/min   GFR calc Af Amer >60 >60 mL/min   Anion gap 6 5 - 15  Lipase, blood  Result Value Ref Range   Lipase 26 11 - 51 U/L  CBC with Differential  Result Value Ref Range   WBC 9.5 4.0 - 10.5 K/uL   RBC 3.70 (L)  3.87 - 5.11 MIL/uL   Hemoglobin 12.1 12.0 - 15.0 g/dL   HCT 40.934.7 (L) 81.136.0 - 91.446.0 %   MCV 93.8 78.0 - 100.0 fL   MCH 32.7 26.0 - 34.0 pg   MCHC 34.9 30.0 - 36.0 g/dL   RDW 78.212.7 95.611.5 - 21.315.5 %   Platelets 242 150 - 400 K/uL   Neutrophils Relative % 54 %   Neutro Abs 5.1 1.7 - 7.7 K/uL   Lymphocytes Relative 39 %   Lymphs Abs 3.7 0.7 - 4.0 K/uL   Monocytes Relative 6 %   Monocytes Absolute 0.5 0.1 - 1.0 K/uL   Eosinophils Relative 1 %   Eosinophils Absolute 0.1 0.0 - 0.7 K/uL   Basophils Relative 0 %   Basophils Absolute 0.0 0.0 - 0.1 K/uL  Urinalysis, Routine w reflex microscopic  Result Value Ref Range   Color, Urine YELLOW YELLOW   APPearance CLEAR CLEAR   Specific Gravity, Urine <1.005 (L) 1.005 - 1.030   pH 6.0 5.0 - 8.0   Glucose, UA NEGATIVE NEGATIVE mg/dL   Hgb urine dipstick TRACE (A) NEGATIVE   Bilirubin Urine NEGATIVE NEGATIVE   Ketones, ur NEGATIVE NEGATIVE mg/dL   Protein, ur NEGATIVE NEGATIVE mg/dL   Nitrite NEGATIVE NEGATIVE   Leukocytes, UA NEGATIVE NEGATIVE  Pregnancy, urine  Result Value Ref Range   Preg Test, Ur NEGATIVE NEGATIVE  Urine microscopic-add on  Result Value Ref Range   Squamous  Epithelial / LPF 0-5 (A) NONE SEEN   WBC, UA 0-5 0 - 5 WBC/hpf   RBC / HPF 0-5 0 - 5 RBC/hpf   Bacteria, UA RARE (A) NONE SEEN   Dg Chest 2 View 03/18/2015  CLINICAL DATA:  Patient with right lower quadrant pain. Blood in stool. EXAM: CHEST  2 VIEW COMPARISON:  Chest radiograph 01/06/2004. FINDINGS: The heart size and mediastinal contours are within normal limits. Both lungs are clear. The visualized skeletal structures are unremarkable. IMPRESSION: No active cardiopulmonary disease. Electronically Signed   By: Annia Beltrew  Davis M.D.   On: 03/18/2015 19:49    2140:  Pt has tol PO well while in the ED without N/V. CT A/P pending. Dispo based on results. Sign out to Dr. Estell HarpinZammit.   Samuel JesterKathleen Tyriek Hofman, DO 03/20/15 1946

## 2015-03-18 NOTE — ED Provider Notes (Signed)
Ct abd neg.  Pt referred to her pcp or gi dr. Luvenia Hellerrourke  Brihanna Devenport, MD 03/18/15 2223

## 2017-10-08 IMAGING — CT CT ABD-PELV W/ CM
2 of 4 series · 16 of 46 positions shown, 18 images · IV contrast (omnipaque)
Comparison: None.

CLINICAL DATA: Acute onset left lower quadrant pain today.

EXAM:
CT ABDOMEN AND PELVIS WITH CONTRAST
TECHNIQUE: Multidetector CT imaging of the abdomen and pelvis was performed
using the standard protocol following bolus administration of
intravenous contrast.
CONTRAST:  100mL OMNIPAQUE IOHEXOL 300 MG/ML  SOLN

[Series 2: abd_pel_with 5.0 b40f · axial · 0.54mm/px · z∈[-648,-262]mm · 13 of 85 slices shown, 15 images]
[im 4/85  soft-tissue]
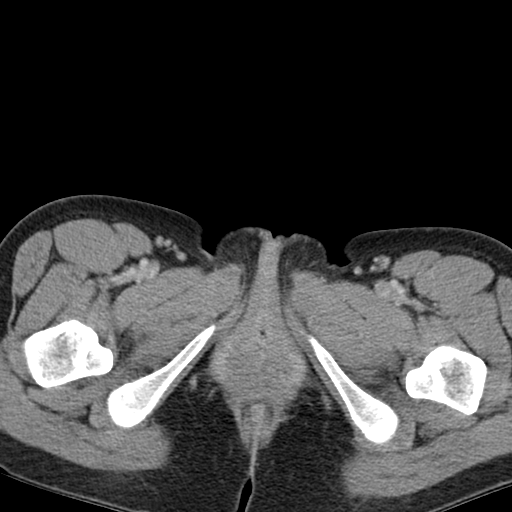
[im 4/85  bone]
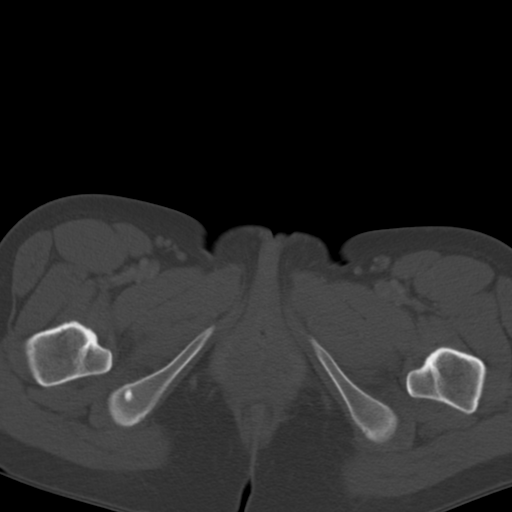
[im 11/85  soft-tissue]
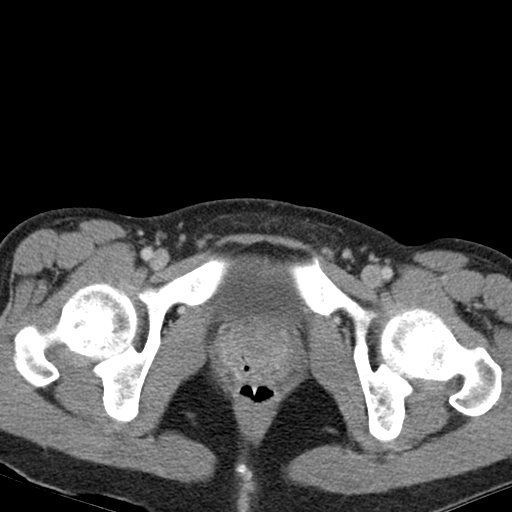
[im 18/85  soft-tissue]
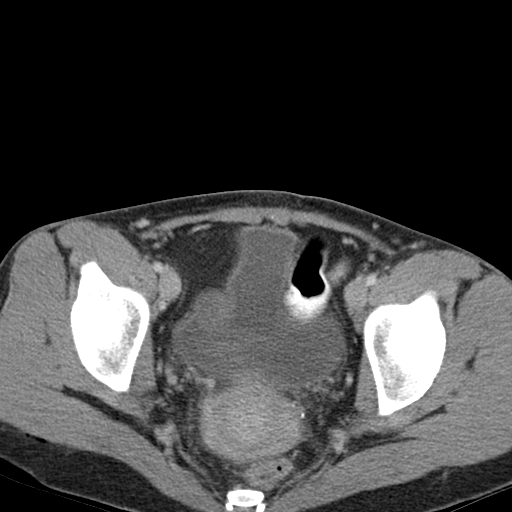
[im 25/85  soft-tissue]
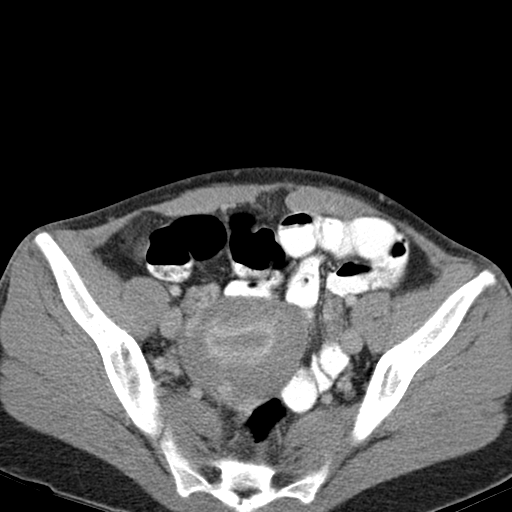
[im 29/85  soft-tissue]
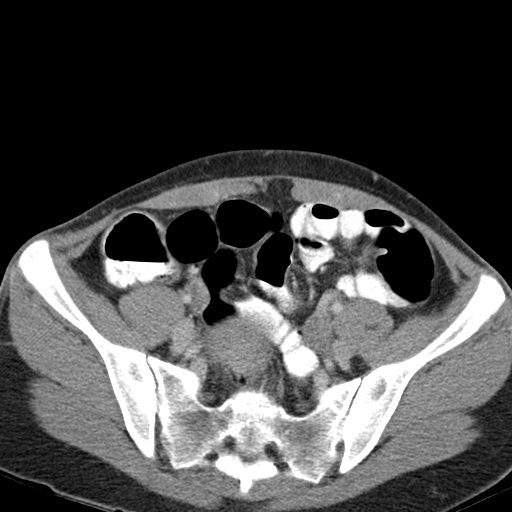
[im 36/85  soft-tissue]
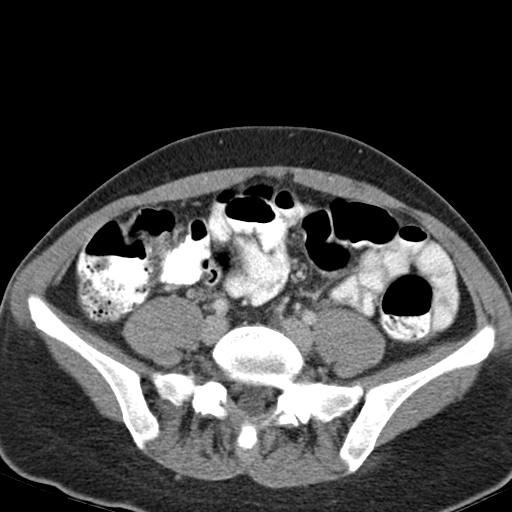
[im 43/85  soft-tissue]
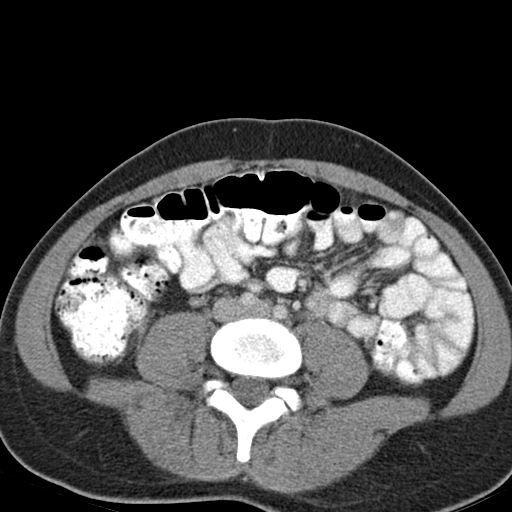
[im 50/85  soft-tissue]
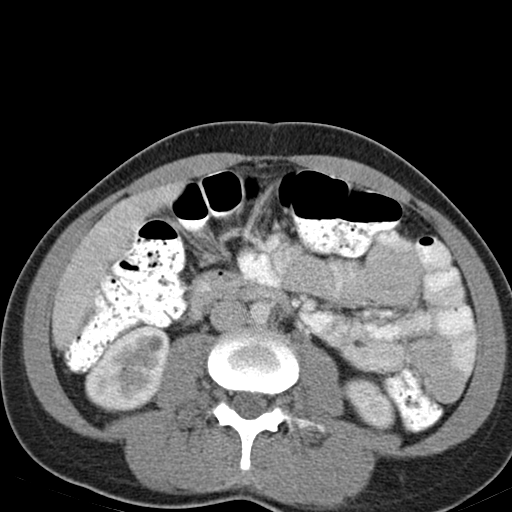
[im 57/85  soft-tissue]
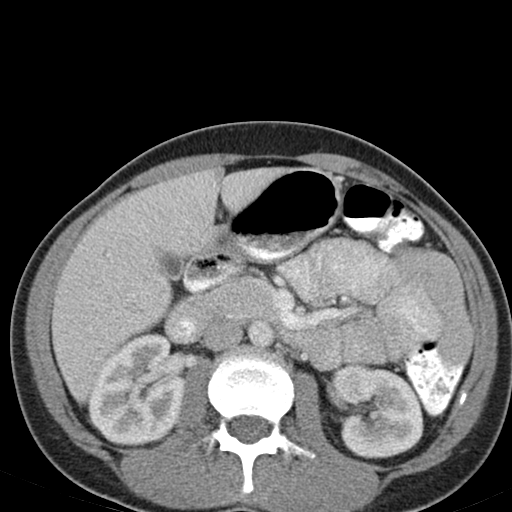
[im 57/85  bone]
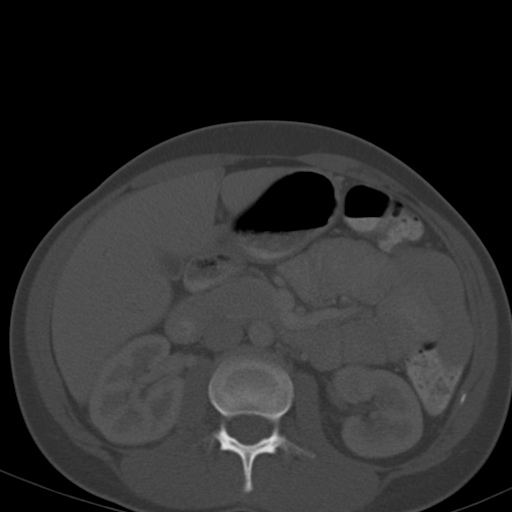
[im 60/85  soft-tissue]
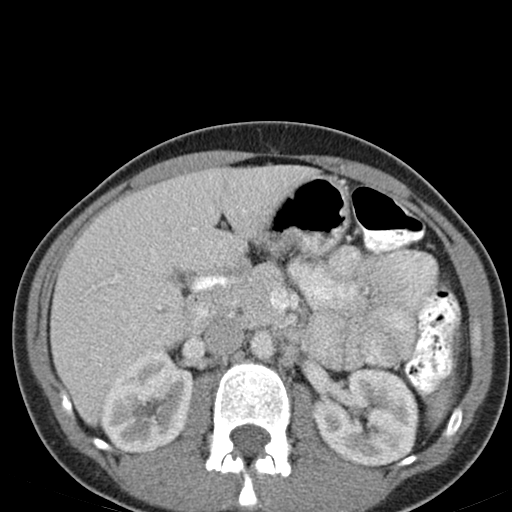
[im 67/85  soft-tissue]
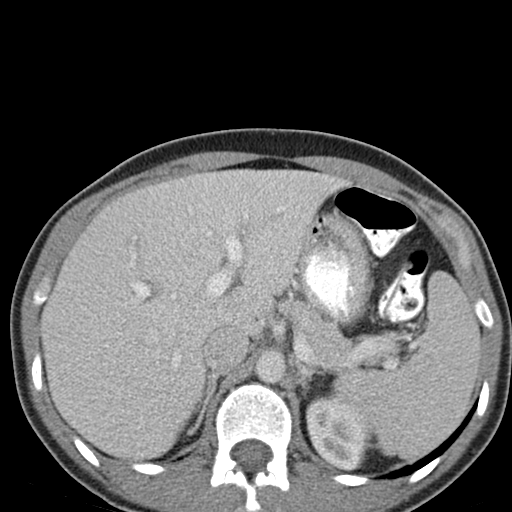
[im 74/85  soft-tissue]
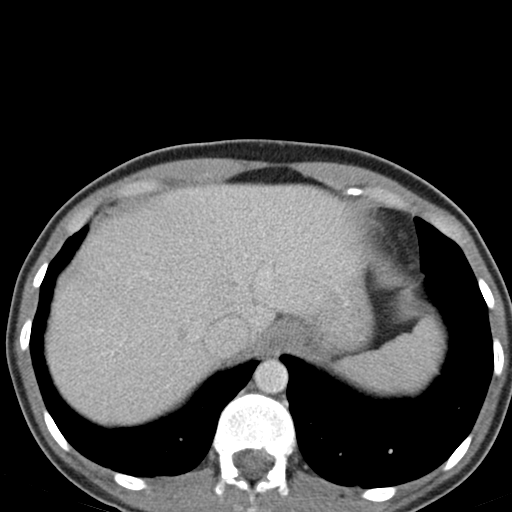
[im 81/85  soft-tissue]
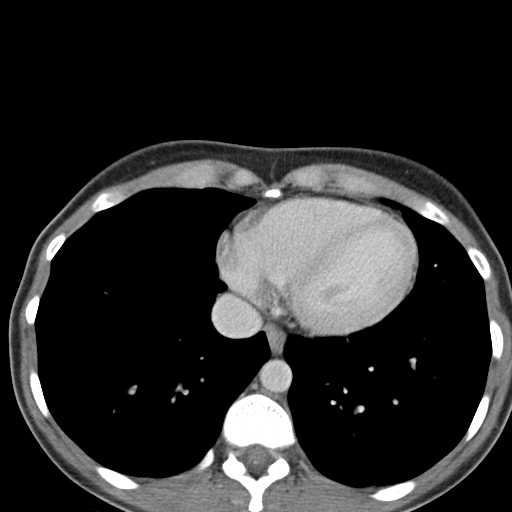

[Series 3: abd_pel_with 3.0 spo cor · coronal · 0.64mm/px · 3 of 83 slices shown]
[im 28/83  soft-tissue]
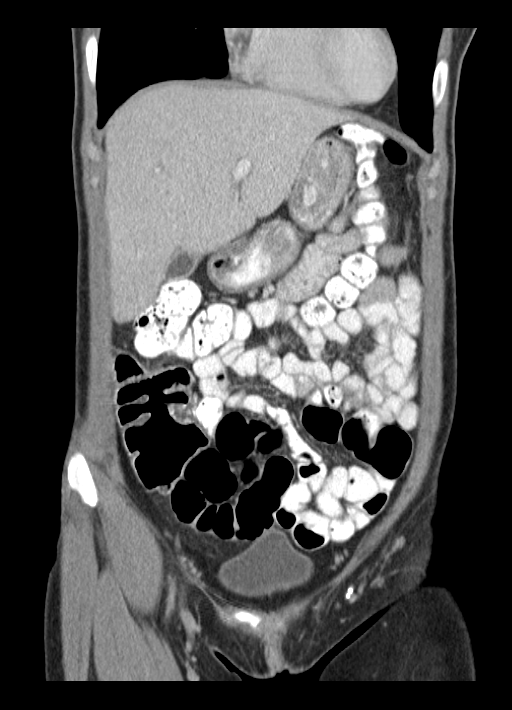
[im 37/83  soft-tissue]
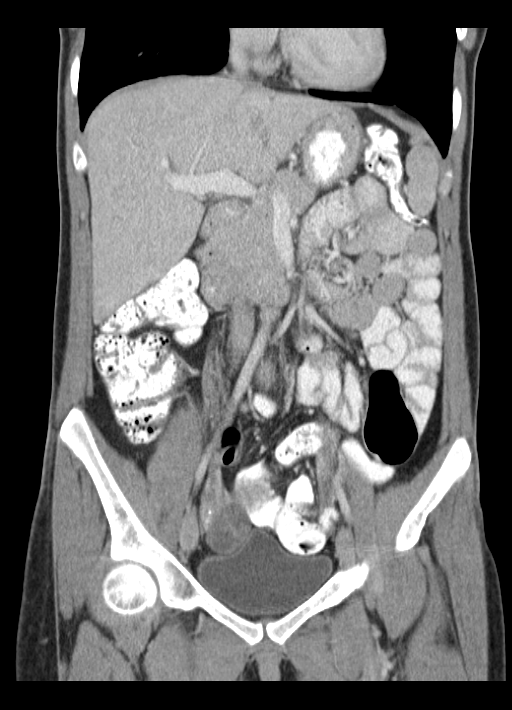
[im 46/83  soft-tissue]
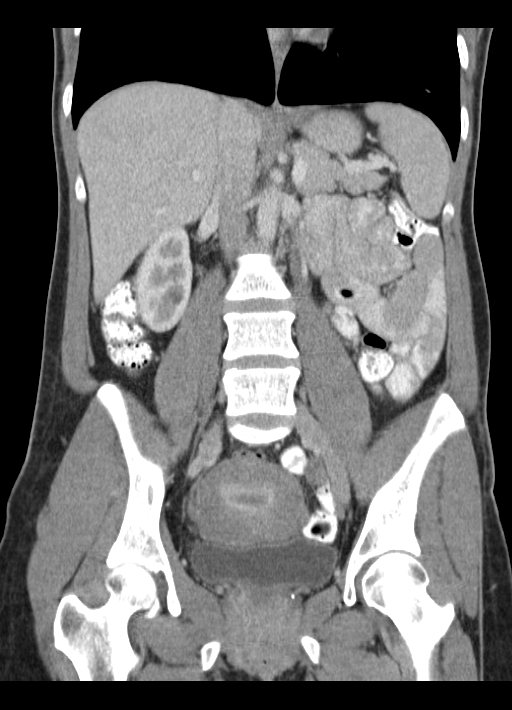

[16 of 46 positions shown; findings below may reference images not displayed]

FINDINGS: Lower chest:  No acute findings.

Hepatobiliary: No masses or other significant abnormality.

Pancreas: No mass, inflammatory changes, or other significant
abnormality.

Spleen: Within normal limits in size and appearance.

Adrenals/Urinary Tract: No masses identified. No evidence of
hydronephrosis. Tiny 2 mm nonobstructive left renal calculus noted
as well as tiny bilateral renal cysts.

Stomach/Bowel: No evidence of obstruction, inflammatory process, or
abnormal fluid collections. Normal appendix visualized.

Vascular/Lymphatic: No pathologically enlarged lymph nodes. No
evidence of abdominal aortic aneurysm.

Reproductive: Several tiny less than 1 cm uterine fibroids noted.

Other: None.

Musculoskeletal:  No suspicious bone lesions identified.
IMPRESSION: No acute findings.

Tiny nonobstructive left renal calculus. No evidence of ureteral
calculi or hydronephrosis.

Tiny less than 1 cm uterine fibroids.

## 2017-10-16 ENCOUNTER — Other Ambulatory Visit: Payer: Self-pay

## 2017-10-16 ENCOUNTER — Emergency Department (HOSPITAL_COMMUNITY)
Admission: EM | Admit: 2017-10-16 | Discharge: 2017-10-17 | Discharge status: Against Medical Advice | Payer: Medicaid - Out of State | Attending: Emergency Medicine | Admitting: Emergency Medicine

## 2017-10-16 ENCOUNTER — Encounter (HOSPITAL_COMMUNITY): Payer: Self-pay

## 2017-10-16 DIAGNOSIS — R1030 Lower abdominal pain, unspecified: Secondary | ICD-10-CM

## 2017-10-16 DIAGNOSIS — F17210 Nicotine dependence, cigarettes, uncomplicated: Secondary | ICD-10-CM | POA: Diagnosis not present

## 2017-10-16 DIAGNOSIS — N760 Acute vaginitis: Secondary | ICD-10-CM | POA: Insufficient documentation

## 2017-10-16 DIAGNOSIS — Z5320 Procedure and treatment not carried out because of patient's decision for unspecified reasons: Secondary | ICD-10-CM | POA: Diagnosis not present

## 2017-10-16 DIAGNOSIS — Z79899 Other long term (current) drug therapy: Secondary | ICD-10-CM | POA: Diagnosis not present

## 2017-10-16 DIAGNOSIS — B9689 Other specified bacterial agents as the cause of diseases classified elsewhere: Secondary | ICD-10-CM | POA: Diagnosis not present

## 2017-10-16 DIAGNOSIS — Z5329 Procedure and treatment not carried out because of patient's decision for other reasons: Secondary | ICD-10-CM

## 2017-10-16 LAB — COMPREHENSIVE METABOLIC PANEL
ALBUMIN: 3.8 g/dL (ref 3.5–5.0)
ALK PHOS: 70 U/L (ref 38–126)
ALT: 30 U/L (ref 14–54)
ANION GAP: 9 (ref 5–15)
AST: 22 U/L (ref 15–41)
BILIRUBIN TOTAL: 0.4 mg/dL (ref 0.3–1.2)
BUN: 6 mg/dL (ref 6–20)
CALCIUM: 9.8 mg/dL (ref 8.9–10.3)
CO2: 26 mmol/L (ref 22–32)
Chloride: 108 mmol/L (ref 101–111)
Creatinine, Ser: 0.76 mg/dL (ref 0.44–1.00)
GFR calc Af Amer: >60 mL/min (ref >60)
GFR calc non Af Amer: >60 mL/min (ref >60)
GLUCOSE: 116 mg/dL — AB (ref 65–99)
POTASSIUM: 4.6 mmol/L (ref 3.5–5.1)
SODIUM: 143 mmol/L (ref 135–145)
Total Protein: 6.6 g/dL (ref 6.5–8.1)

## 2017-10-16 LAB — URINALYSIS, ROUTINE W REFLEX MICROSCOPIC
Glucose, UA: NEGATIVE mg/dL
HGB URINE DIPSTICK: NEGATIVE
KETONES UR: 5 mg/dL — AB
Leukocytes, UA: NEGATIVE
Nitrite: NEGATIVE
PH: 5 (ref 5.0–8.0)
Protein, ur: NEGATIVE mg/dL
SPECIFIC GRAVITY, URINE: 1.026 (ref 1.005–1.030)

## 2017-10-16 LAB — CBC
HEMATOCRIT: 38.3 % (ref 36.0–46.0)
HEMOGLOBIN: 11.9 g/dL — AB (ref 12.0–15.0)
MCH: 30.4 pg (ref 26.0–34.0)
MCHC: 31.1 g/dL (ref 30.0–36.0)
MCV: 97.7 fL (ref 78.0–100.0)
Platelets: 274 10*3/uL (ref 150–400)
RBC: 3.92 MIL/uL (ref 3.87–5.11)
RDW: 13.2 % (ref 11.5–15.5)
WBC: 9.6 10*3/uL (ref 4.0–10.5)

## 2017-10-16 LAB — I-STAT BETA HCG BLOOD, ED (MC, WL, AP ONLY): I-stat hCG, quantitative: <5 m[IU]/mL (ref <5)

## 2017-10-16 LAB — WET PREP, GENITAL
Sperm: NONE SEEN
TRICH WET PREP: NONE SEEN
YEAST WET PREP: NONE SEEN

## 2017-10-16 LAB — LIPASE, BLOOD: Lipase: 31 U/L (ref 11–51)

## 2017-10-16 MED ORDER — OXYCODONE-ACETAMINOPHEN 5-325 MG PO TABS
1.0000 | ORAL_TABLET | Freq: Once | ORAL | Status: AC
  Administered 2017-10-16: 1 via ORAL
  Filled 2017-10-16: qty 1

## 2017-10-16 MED ORDER — MORPHINE SULFATE (PF) 4 MG/ML IV SOLN
4.0000 mg | Freq: Once | INTRAVENOUS | Status: AC
  Administered 2017-10-17: 4 mg via INTRAVENOUS
  Filled 2017-10-16: qty 1

## 2017-10-16 MED ORDER — SODIUM CHLORIDE 0.9 % IV BOLUS
1000.0000 mL | Freq: Once | INTRAVENOUS | Status: AC
  Administered 2017-10-17: 1000 mL via INTRAVENOUS

## 2017-10-16 MED ORDER — ONDANSETRON HCL 4 MG/2ML IJ SOLN
4.0000 mg | Freq: Once | INTRAMUSCULAR | Status: AC
  Administered 2017-10-17: 4 mg via INTRAVENOUS
  Filled 2017-10-16: qty 2

## 2017-10-16 NOTE — ED Notes (Signed)
Pelvic cart at door 

## 2017-10-16 NOTE — ED Notes (Signed)
ED Provider at bedside. 

## 2017-10-16 NOTE — ED Triage Notes (Signed)
Pt endorses abd pain and UTI. Pt went to [place] and was diagnosed with UTI and intestinal blockage last weekend. Pt still having abd in the RLQ radiating to the back. Pt has been on antibiotics for the UTI. Tachy in triage.

## 2017-10-16 NOTE — ED Notes (Signed)
[facility] Rubin [facility]Pickering [facility]

## 2017-10-16 NOTE — ED Provider Notes (Cosign Needed)
Patient placed in Quick Look pathway, seen and evaluated   Chief Complaint: Abdominal pain  HPI:   32 year old female presents with lower abdominal pain that wraps around to her back for the past couple weeks. It has been gradually worsening. [facility] and was diagnosed with a UTI and "intestinal blockage" which sounds like it was most likely constipation. She thinks she had a CT and xray but is unsure. She doesn't recall having a pelvic exam done. She was discharged with antibiotics and pain medicine. She has not improved. She denies fever but has nausea without vomiting. She has urinary frequency but no dysuria. No vaginal discharge or bleeding. She has a past surgical hx of left salpingooopherectomy due to a ruptured EP, C-section, tubal ligation  ROS: +_abdominal pain  Physical Exam:   Gen: No distress  Neuro: Awake and Alert  Skin: Warm    Focused Exam: Diffuse lower abdominal tenderness   Initiation of care has begun. The patient has been counseled on the process, plan, and necessity for staying for the completion/evaluation, and the remainder of the medical screening examination    Bethel BornGekas, Jerre Vandrunen Marie, PA-C 10/16/17 1942

## 2017-10-16 NOTE — ED Provider Notes (Signed)
[facility] Provider Note   CSN: [id number] Arrival date & time: 10/16/17  1843     History   Chief Complaint Chief Complaint  Patient presents with  . Urinary Tract Infection  . Abdominal Pain  . Intestinal Blockage    HPI Angelica Cuevas is a 32 y.o. female who presents here to the emergency department today for lower abdominal pain.  Patient reports she has been dealing with urinary symptoms consistent with UTI over the last several weeks.  She reports [place] and [facility] for this where she had a abdominal x-ray, CT scan, lab work and urinalysis done.  This showed evidence of a UTI and the patient was discharged home on nitrofurantoin.  She reports that the CT scan showed intestinal blockage but on review of faxed notes this reports that the patient has constipation with dilated bowel.  There is no evidence of obstruction.  Impression reads likely mild colonic ileus.  Patient was discharged home on stool softeners for this.  She reports that this has relieved her symptoms.  She is now having daily bowel movements with the last bowel movement today at 8 AM.  She denies any melena or hematochezia.  No diarrhea.  She reports she is still passing flatus.  Patient reports that since being seen starting new medications her urinary symptoms and constipation has resolved.  She reports however she now has been having diffuse lower abdominal pain with radiation to her back that she describes as cramping.  She reports that the pain wakes her up around 6:30 AM every morning.  She is tried taking Tylenol for this without any relief.  She reports the pain is constant and is worse with palpation.  She denies any radiation of the pain into her pelvis.  No vaginal discharge.  She reports she is currently on her menses.  Patient is sexually active with female partners.  She would like to be tested for STDs today.   Patient denies any associated fever, chills, chest pain, shortness of breath, cough, upper abdominal pain, nausea/vomiting/diarrhea, flank pain, urinary frequency, urinary urgency, dysuria, hematuria.  Patient reports she has had a prior oophorectomy on the left.  HPI  Past Medical History:  Diagnosis Date  . Anxiety   . Chronic neck pain   . Depression   . Headache   . Seizures (HCC)     There are no active problems to display for this patient.   Past Surgical History:  Procedure Laterality Date  . CESAREAN SECTION    . ECTOPIC PREGNANCY SURGERY    . LAPAROSCOPIC SALPINGO OOPHERECTOMY Left   . TUBAL LIGATION       OB History   None      Home Medications    Prior to Admission medications   Medication Sig Start Date End Date Taking? Authorizing Provider  acetaminophen (TYLENOL) 500 MG tablet Take 500-1,000 mg by mouth every 6 (six) hours as needed for mild pain, moderate pain, fever or headache.    Yes [provider]  docusate sodium (COLACE) 100 MG capsule Take 100 mg by mouth 2 (two) times daily.   Yes [provider]  ibuprofen (ADVIL,MOTRIN) 800 MG tablet Take 400 mg by mouth every 8 (eight) hours as needed for fever, headache, mild pain, moderate pain or cramping.   Yes [provider]  nitrofurantoin (MACRODANTIN) 100 MG capsule Take 100 mg by mouth 2 (two) times  daily.   Yes [provider]  acetaminophen-codeine (TYLENOL #3) 300-30 MG tablet Take 1-2 tablets by mouth every 6 (six) hours as needed for moderate pain. Patient not taking: Reported on 03/18/2015 02/14/15   Ivery Quale, PA-C  amoxicillin (AMOXIL) 500 MG capsule Take 1 capsule (500 mg total) by mouth 3 (three) times daily. Patient not taking: Reported on 03/18/2015 02/14/15   Ivery Quale, PA-C    Family History History reviewed. No pertinent family history.  Social History Social History   Tobacco Use  . Smoking status: Current Every Day Smoker     Packs/day: 1.50    Types: Cigarettes  Substance Use Topics  . Alcohol use: No  . Drug use: Yes    Types: Marijuana    Comment: every day     Allergies   Patient has no known allergies.   Review of Systems Review of Systems  All other systems reviewed and are negative.    Physical Exam Updated Vital Signs BP 117/78   Pulse 95   Temp 98.4 F (36.9 C) (Oral)   Resp 16   Ht 5\' 1"  (1.549 m)   Wt 51.3 kg (113 lb)   LMP 10/16/2017 (Exact Date)   SpO2 100%   BMI 21.35 kg/m   Physical Exam  Constitutional: She appears well-developed and well-nourished.  HENT:  Head: Normocephalic and atraumatic.  Right Ear: External ear normal.  Left Ear: External ear normal.  Nose: Nose normal.  Mouth/Throat: Uvula is midline, oropharynx is clear and moist and mucous membranes are normal. No tonsillar exudate.  Eyes: Pupils are equal, round, and reactive to light. Right eye exhibits no discharge. Left eye exhibits no discharge. No scleral icterus.  Neck: Trachea normal. Neck supple. No spinous process tenderness present. No neck rigidity. Normal range of motion present.  Cardiovascular: Normal rate, regular rhythm and intact distal pulses.  No murmur heard. Pulses:      Radial pulses are 2+ on the right side, and 2+ on the left side.       Dorsalis pedis pulses are 2+ on the right side, and 2+ on the left side.       Posterior tibial pulses are 2+ on the right side, and 2+ on the left side.  No lower extremity swelling or edema. Calves symmetric in size bilaterally.  Pulmonary/Chest: Effort normal and breath sounds normal. She exhibits no tenderness.  Abdominal: Soft. Bowel sounds are normal. She exhibits no distension. There is tenderness in the right lower quadrant, suprapubic area and left lower quadrant. There is no rigidity, no rebound, no guarding and no CVA tenderness.  Genitourinary:  Genitourinary Comments: Exam performed by Jacinto Halim, exam chaperoned Pelvic exam:  normal external genitalia without evidence of trauma. VULVA: normal appearing vulva with no masses, tenderness or lesion. VAGINA: normal appearing vagina with normal color and discharge, no lesions. CERVIX: normal appearing cervix without lesions, cervical motion tenderness absent, cervical os closed with out purulent discharge. There is bleeding as a result of patient's menses. Swabs used to obtain view of cervix Wet prep and DNA probe for chlamydia and GC obtained.   ADNEXA: No palpable left adnexa. Tenderness of the left adenexa.  UTERUS: uterus is normal size, shape, consistency and nontender.   Musculoskeletal: She exhibits no edema.  Lymphadenopathy:    She has no cervical adenopathy.  Neurological: She is alert.  Skin: Skin is warm and dry. No rash noted. She is not diaphoretic.  Psychiatric: She has a normal mood  and affect.  Nursing note and vitals reviewed.    ED Treatments / Results  Labs (all labs ordered are listed, but only abnormal results are displayed) Labs Reviewed  COMPREHENSIVE METABOLIC PANEL - Abnormal; Notable for the following components:      Result Value   Glucose, Bld 116 (*)    All other components within normal limits  CBC - Abnormal; Notable for the following components:   Hemoglobin 11.9 (*)    All other components within normal limits  URINALYSIS, ROUTINE W REFLEX MICROSCOPIC - Abnormal; Notable for the following components:   Color, Urine AMBER (*)    Bilirubin Urine SMALL (*)    Ketones, ur 5 (*)    All other components within normal limits  WET PREP, GENITAL  LIPASE, BLOOD  I-STAT BETA HCG BLOOD, ED ([facility], [facility], [facility] ONLY)  GC/CHLAMYDIA PROBE AMP ([place]) NOT AT [facility]    EKG None  Radiology No results found.  Procedures Procedures (including critical care time)  Medications Ordered in ED Medications  ondansetron (ZOFRAN) injection 4 mg (has no administration in time range)  sodium chloride 0.9 % bolus 1,000 mL (has no administration  in time range)  morphine 4 MG/ML injection 4 mg (has no administration in time range)  oxyCODONE-acetaminophen (PERCOCET/ROXICET) 5-325 MG per tablet 1 tablet (1 tablet Oral Given 10/16/17 2144)     Initial Impression / Assessment and Plan / ED Course  I have reviewed the triage vital signs and the nursing notes.  Pertinent labs & imaging results that were available during my care of the patient were reviewed by me and considered in my medical decision making (see chart for details).     32 y.o. female who was recently seen in [place] and dx with a UTI, ileus and constipation. She notes constipation and UTI symptoms have resolved but she has developed worsening abdominal pain of the lower abdomen. Denies assc symptoms. Vitals with tachycardia on presentation. No fever, tachypnea, hypoxia or hypotension. She is non-septic appearing. Patient with lower abdominal TTP diffusley. No point TTP. Pelvic exam without CMT. Patient with right adenexal TTP. Will evaluate with pelvic US and CT scan. Pain medication, and IVF given. Labs, UA ordered. Patient requested HIV, Syphillis added onto STD testing.   Pregnancy test negative. UA without UTI.  No significant electrolyte derangements.  No evidence of DKA.  No acute kidney injury.  LFTs within normal limits.  No leukocytosis.  Mild anemia.  Wet prep without evidence of trichomonas.  There is clue cells with white blood cells.  Will treat for BV.    Patient wants to smoke a cigarette. She is refusing imaging.  We discussed the nature and purpose, risks and benefits, as well as, the alternatives of treatment. Time was given to allow the opportunity to ask questions and consider their options, and after the discussion, the patient decided to refuse the offerred treatment. The patient was informed that refusal could lead to, but was not limited to, death, permanent disability, or severe pain (this was included on discharge paperwork). If present, I asked the  relatives or significant others to dissuade them without success. Prior to refusing, I determined that the patient had the capacity to make their decision and understood the consequences of that decision. After refusal, I made every reasonable opportunity to treat them to the best of my ability.  The patient was notified that they may return to the emergency department at any time for further treatment.  Final Clinical Impressions(s) / ED Diagnoses   Final diagnoses:  Lower abdominal pain  Bacterial vaginosis  Left against medical advice    ED Discharge Orders        Ordered    metroNIDAZOLE (FLAGYL) 500 MG tablet  2 times daily with meals     10/17/17 0126       Jacinto Halim, PA-C 10/17/17 1610    Benjiman Core, MD 10/18/17 1517

## 2017-10-16 NOTE — ED Notes (Signed)
Faxed release of info to [place] @ 2000

## 2017-10-16 NOTE — ED Notes (Signed)
Pt given pain med then went outside to smoke

## 2017-10-17 LAB — RPR: RPR Ser Ql: NONREACTIVE

## 2017-10-17 LAB — GC/CHLAMYDIA PROBE AMP (~~LOC~~) NOT AT ARMC
Chlamydia: NEGATIVE
NEISSERIA GONORRHEA: POSITIVE — AB

## 2017-10-17 LAB — HIV ANTIBODY (ROUTINE TESTING W REFLEX): HIV Screen 4th Generation wRfx: NONREACTIVE

## 2017-10-17 MED ORDER — IOPAMIDOL (ISOVUE-300) INJECTION 61%
INTRAVENOUS | Status: AC
  Filled 2017-10-17: qty 30

## 2017-10-17 MED ORDER — METRONIDAZOLE 500 MG PO TABS: 500.0000 mg | ORAL_TABLET | Freq: Two times a day (BID) | ORAL | 0 refills | Status: AC

## 2017-10-17 NOTE — ED Notes (Signed)
Pt refused d/c vital signs 

## 2017-10-17 NOTE — ED Notes (Signed)
Pt requesting to leave, EDP notified and at bedside, pt to leave AMA.

## 2017-10-17 NOTE — Discharge Instructions (Addendum)
You were seen here today for lower abdominal pain. You have decided to leave against medical advice. As we discussed, leaving against medical advice can lead to but is not limited to, death, permanent disability, or severe pain. Your lab results that have return are reassuring. Your wet prep shows that you have bacterial vaginosis.  Please see attached handout on this.  I am prescribing you Flagyl. Please take to completion. Please do not drink alcohol while taking his medication as it will make you feel very ill.  Please follow with your primary care soon as possible.  You can return anytime for further evaluation of your symptoms. If you develop worsening or new concerning symptoms or would like to finish being evaluate you can return to the emergency department for re-evaluation.

## 2017-10-17 NOTE — ED Notes (Signed)
IV team at bedside 

## 2017-10-17 NOTE — ED Notes (Signed)
US pelvis complete clicked in error

## 2017-10-19 ENCOUNTER — Encounter (HOSPITAL_COMMUNITY): Payer: Self-pay | Admitting: Emergency Medicine

## 2017-10-19 ENCOUNTER — Other Ambulatory Visit: Payer: Self-pay

## 2017-10-19 ENCOUNTER — Emergency Department (HOSPITAL_COMMUNITY)
Admission: EM | Admit: 2017-10-19 | Discharge: 2017-10-19 | Disposition: A | Discharge status: Home / Self Care | Payer: Medicaid - Out of State | Attending: Emergency Medicine | Admitting: Emergency Medicine

## 2017-10-19 DIAGNOSIS — Z5321 Procedure and treatment not carried out due to patient leaving prior to being seen by health care provider: Secondary | ICD-10-CM | POA: Diagnosis not present

## 2017-10-19 DIAGNOSIS — R109 Unspecified abdominal pain: Secondary | ICD-10-CM | POA: Insufficient documentation

## 2017-10-19 LAB — CBC
HEMATOCRIT: 37 % (ref 36.0–46.0)
Hemoglobin: 12 g/dL (ref 12.0–15.0)
MCH: 31 pg (ref 26.0–34.0)
MCHC: 32.4 g/dL (ref 30.0–36.0)
MCV: 95.6 fL (ref 78.0–100.0)
Platelets: 320 10*3/uL (ref 150–400)
RBC: 3.87 MIL/uL (ref 3.87–5.11)
RDW: 12.9 % (ref 11.5–15.5)
WBC: 9.5 10*3/uL (ref 4.0–10.5)

## 2017-10-19 LAB — COMPREHENSIVE METABOLIC PANEL
ALBUMIN: 4.4 g/dL (ref 3.5–5.0)
ALT: 22 U/L (ref 14–54)
AST: 18 U/L (ref 15–41)
Alkaline Phosphatase: 70 U/L (ref 38–126)
Anion gap: 10 (ref 5–15)
BILIRUBIN TOTAL: 0.8 mg/dL (ref 0.3–1.2)
BUN: 17 mg/dL (ref 6–20)
CHLORIDE: 106 mmol/L (ref 101–111)
CO2: 22 mmol/L (ref 22–32)
Calcium: 9.7 mg/dL (ref 8.9–10.3)
Creatinine, Ser: 0.81 mg/dL (ref 0.44–1.00)
GFR calc Af Amer: >60 mL/min (ref >60)
GFR calc non Af Amer: >60 mL/min (ref >60)
Glucose, Bld: 87 mg/dL (ref 65–99)
POTASSIUM: 4 mmol/L (ref 3.5–5.1)
Sodium: 138 mmol/L (ref 135–145)
TOTAL PROTEIN: 7.4 g/dL (ref 6.5–8.1)

## 2017-10-19 LAB — I-STAT BETA HCG BLOOD, ED (MC, WL, AP ONLY): I-stat hCG, quantitative: <5 m[IU]/mL (ref <5)

## 2017-10-19 LAB — LIPASE, BLOOD: Lipase: 23 U/L (ref 11–51)

## 2017-10-19 NOTE — ED Notes (Signed)
Pt returned from bathroom stating she forgot urinate in specimen cup, pt inquired about wait times, advised pt longest wait is 2 hrs at this time. Pt states she did not want to wait, also stating she has medicaid and will call 911 to go to the back, I advised pt she would most likely come back to triage to initiate care. Advised pt to let staff know if she decides to leave AMA

## 2017-10-19 NOTE — ED Notes (Signed)
Pt left post triage, called 3 times to go to a room and no response.

## 2017-10-19 NOTE — ED Notes (Signed)
Pt outside smoking cigarette

## 2017-10-19 NOTE — ED Triage Notes (Signed)
Pt presents with recurrent abd pain, radiating around to low back, pt states she has been seen recently for this on several occasions. Pt tearful in triage, denies knowledge of vaginal discharge d/t currently on menstrual cycle.

## 2017-10-19 NOTE — ED Notes (Signed)
Pt called for room and went outside and called. No response x3
